# Patient Record
Sex: Female | Born: 1990 | Race: Black or African American | Hispanic: No | Marital: Single | Smoking: Never smoker
Health system: Southern US, Community
[De-identification: ages and names within clinical notes are randomized; demographics above are authoritative.]

## PROBLEM LIST (undated history)

## (undated) ENCOUNTER — Inpatient Hospital Stay (HOSPITAL_COMMUNITY): Payer: Self-pay

## (undated) DIAGNOSIS — G4730 Sleep apnea, unspecified: Secondary | ICD-10-CM

## (undated) DIAGNOSIS — R87629 Unspecified abnormal cytological findings in specimens from vagina: Secondary | ICD-10-CM

## (undated) DIAGNOSIS — D649 Anemia, unspecified: Secondary | ICD-10-CM

## (undated) DIAGNOSIS — N83209 Unspecified ovarian cyst, unspecified side: Secondary | ICD-10-CM

## (undated) DIAGNOSIS — G43019 Migraine without aura, intractable, without status migrainosus: Secondary | ICD-10-CM

## (undated) DIAGNOSIS — G40909 Epilepsy, unspecified, not intractable, without status epilepticus: Secondary | ICD-10-CM

## (undated) DIAGNOSIS — G47419 Narcolepsy without cataplexy: Secondary | ICD-10-CM

## (undated) DIAGNOSIS — R0600 Dyspnea, unspecified: Secondary | ICD-10-CM

## (undated) HISTORY — DX: Sleep apnea, unspecified: G47.30

## (undated) HISTORY — PX: NO PAST SURGERIES: SHX2092

## (undated) HISTORY — DX: Dyspnea, unspecified: R06.00

## (undated) HISTORY — DX: Migraine without aura, intractable, without status migrainosus: G43.019

## (undated) HISTORY — DX: Morbid (severe) obesity due to excess calories: E66.01

---

## 2017-01-25 ENCOUNTER — Emergency Department (HOSPITAL_COMMUNITY)
Admission: EM | Admit: 2017-01-25 | Discharge: 2017-01-26 | Disposition: A | Discharge status: Home / Self Care | Payer: Medicaid Other | Attending: Emergency Medicine | Admitting: Emergency Medicine

## 2017-01-25 DIAGNOSIS — N730 Acute parametritis and pelvic cellulitis: Secondary | ICD-10-CM | POA: Diagnosis not present

## 2017-01-25 DIAGNOSIS — R1030 Lower abdominal pain, unspecified: Secondary | ICD-10-CM | POA: Diagnosis present

## 2017-01-25 LAB — URINALYSIS, ROUTINE W REFLEX MICROSCOPIC
BILIRUBIN URINE: NEGATIVE
GLUCOSE, UA: NEGATIVE mg/dL
Hgb urine dipstick: NEGATIVE
Ketones, ur: NEGATIVE mg/dL
Leukocytes, UA: NEGATIVE
Nitrite: NEGATIVE
PH: 5 (ref 5.0–8.0)
Protein, ur: NEGATIVE mg/dL
Specific Gravity, Urine: 1.028 (ref 1.005–1.030)

## 2017-01-25 LAB — COMPREHENSIVE METABOLIC PANEL
ALBUMIN: 3.4 g/dL — AB (ref 3.5–5.0)
ALT: 13 U/L — ABNORMAL LOW (ref 14–54)
ANION GAP: 8 (ref 5–15)
AST: 17 U/L (ref 15–41)
Alkaline Phosphatase: 64 U/L (ref 38–126)
BUN: 7 mg/dL (ref 6–20)
CO2: 26 mmol/L (ref 22–32)
Calcium: 8.8 mg/dL — ABNORMAL LOW (ref 8.9–10.3)
Chloride: 104 mmol/L (ref 101–111)
Creatinine, Ser: 0.86 mg/dL (ref 0.44–1.00)
GFR calc Af Amer: >60 mL/min (ref >60)
GFR calc non Af Amer: >60 mL/min (ref >60)
GLUCOSE: 85 mg/dL (ref 65–99)
POTASSIUM: 4.3 mmol/L (ref 3.5–5.1)
SODIUM: 138 mmol/L (ref 135–145)
Total Bilirubin: 0.4 mg/dL (ref 0.3–1.2)
Total Protein: 6.2 g/dL — ABNORMAL LOW (ref 6.5–8.1)

## 2017-01-25 LAB — CBC
HEMATOCRIT: 40.2 % (ref 36.0–46.0)
Hemoglobin: 12.7 g/dL (ref 12.0–15.0)
MCH: 25.9 pg — ABNORMAL LOW (ref 26.0–34.0)
MCHC: 31.6 g/dL (ref 30.0–36.0)
MCV: 81.9 fL (ref 78.0–100.0)
Platelets: 322 10*3/uL (ref 150–400)
RBC: 4.91 MIL/uL (ref 3.87–5.11)
RDW: 14.2 % (ref 11.5–15.5)
WBC: 10.8 10*3/uL — ABNORMAL HIGH (ref 4.0–10.5)

## 2017-01-25 LAB — LIPASE, BLOOD: Lipase: 15 U/L (ref 11–51)

## 2017-01-25 NOTE — ED Notes (Signed)
Pt up to desk asking for update, provided answers to questions and apologized for delay.

## 2017-01-25 NOTE — ED Triage Notes (Signed)
Pt states for the last 2 days she has been having lower abd cramping and body aches all over. Pt denies any n/v/d. Pt reports history of ovarian cyst.

## 2017-01-26 LAB — WET PREP, GENITAL
SPERM: NONE SEEN
Trich, Wet Prep: NONE SEEN
Yeast Wet Prep HPF POC: NONE SEEN

## 2017-01-26 LAB — RAPID HIV SCREEN (HIV 1/2 AB+AG)
HIV 1/2 ANTIBODIES: NONREACTIVE
HIV-1 P24 ANTIGEN - HIV24: NONREACTIVE

## 2017-01-26 LAB — GC/CHLAMYDIA PROBE AMP (~~LOC~~) NOT AT ARMC
Chlamydia: NEGATIVE
Neisseria Gonorrhea: NEGATIVE

## 2017-01-26 LAB — I-STAT BETA HCG BLOOD, ED (MC, WL, AP ONLY): I-stat hCG, quantitative: <5 m[IU]/mL (ref <5)

## 2017-01-26 LAB — HIV ANTIBODY (ROUTINE TESTING W REFLEX): HIV SCREEN 4TH GENERATION: NONREACTIVE

## 2017-01-26 MED ORDER — CEFTRIAXONE SODIUM 250 MG IJ SOLR
250.0000 mg | Freq: Once | INTRAMUSCULAR | Status: AC
  Administered 2017-01-26: 250 mg via INTRAMUSCULAR
  Filled 2017-01-26: qty 250

## 2017-01-26 MED ORDER — AZITHROMYCIN 250 MG PO TABS
1000.0000 mg | ORAL_TABLET | Freq: Once | ORAL | Status: AC
  Administered 2017-01-26: 1000 mg via ORAL
  Filled 2017-01-26: qty 4

## 2017-01-26 MED ORDER — DOXYCYCLINE HYCLATE 100 MG PO CAPS: 100.0000 mg | ORAL_CAPSULE | Freq: Two times a day (BID) | ORAL | 0 refills | Status: DC

## 2017-01-26 NOTE — ED Provider Notes (Signed)
[facility]-EMERGENCY DEPT Provider Note   CSN: [id number] Arrival date & time: 01/25/17  1815     History   Chief Complaint Chief Complaint  Patient presents with  . Abdominal Pain    HPI Heather Ward is a 26 y.o. female.  HPI Pt comes in with cc of abd pain. Pt reports that she has been having lower abd pain, cramping for the last2 days. Pain is intermittent, but getting worse. Pt has hx of ovarian cyst. Pt  Has no uti like symptoms and denies any vaginal bleeding. Pt is having unprotected intercourse with 1 partner, she is not sure if he is monogamous. No n/v/f/c/diarrhea.   No past medical history on file.  There are no active problems to display for this patient.   No past surgical history on file.  OB History    No data available       Home Medications    Prior to Admission medications   Medication Sig Start Date End Date Taking? Authorizing Provider  ibuprofen (ADVIL,MOTRIN) 800 MG tablet Take 800 mg by mouth every 8 (eight) hours as needed for moderate pain.   Yes Historical Provider, MD  doxycycline (VIBRAMYCIN) 100 MG capsule Take 1 capsule (100 mg total) by mouth 2 (two) times daily. 01/26/17   Derwood Kaplan, MD    Family History No family history on file.  Social History Social History  Substance Use Topics  . Smoking status: Not on file  . Smokeless tobacco: Not on file  . Alcohol use Not on file     Allergies   Patient has no known allergies.   Review of Systems Review of Systems  Gastrointestinal: Positive for abdominal pain.   ROS 10 Systems reviewed and are negative for acute change except as noted in the HPI.      Physical Exam Updated Vital Signs BP (!) 119/53   Pulse 70   Temp 98.5 F (36.9 C)   Resp 18   Ht 5\' 7"  (1.702 m)   Wt (!) 301 lb (136.5 kg)   LMP 01/16/2017   SpO2 100%   BMI 47.14 kg/m   Physical Exam  Constitutional: She is oriented to person, place, and time. She appears well-developed.  HENT:  Head:  Normocephalic and atraumatic.  Eyes: Conjunctivae and EOM are normal. Pupils are equal, round, and reactive to light.  Neck: Normal range of motion. Neck supple.  Cardiovascular: Normal rate, regular rhythm, normal heart sounds and intact distal pulses.   No murmur heard. Pulmonary/Chest: Effort normal. No respiratory distress. She has no wheezes.  Abdominal: Soft. Bowel sounds are normal. She exhibits no distension. There is no tenderness. There is no rebound and no guarding.  Genitourinary: Vagina normal and uterus normal.  Genitourinary Comments: External exam - normal, no lesions Speculum exam: Pt has some white discharge, no blood Bimanual exam: Patient has CMT, no adnexal tenderness or fullness and cervical os is closed  Neurological: She is alert and oriented to person, place, and time.  Skin: Skin is warm and dry.  Nursing note and vitals reviewed.    ED Treatments / Results  Labs (all labs ordered are listed, but only abnormal results are displayed) Labs Reviewed  WET PREP, GENITAL - Abnormal; Notable for the following:       Result Value   Clue Cells Wet Prep HPF POC PRESENT (*)    WBC, Wet Prep HPF POC FEW (*)    All other components within normal limits  COMPREHENSIVE METABOLIC PANEL -  Abnormal; Notable for the following:    Calcium 8.8 (*)    Total Protein 6.2 (*)    Albumin 3.4 (*)    ALT 13 (*)    All other components within normal limits  CBC - Abnormal; Notable for the following:    WBC 10.8 (*)    MCH 25.9 (*)    All other components within normal limits  LIPASE, BLOOD  URINALYSIS, ROUTINE W REFLEX MICROSCOPIC  RAPID HIV SCREEN (HIV 1/2 AB+AG)  HIV ANTIBODY (ROUTINE TESTING)  POC URINE PREG, ED  I-STAT BETA HCG BLOOD, ED ([facility], [facility], [facility] ONLY)  GC/CHLAMYDIA PROBE AMP ([place]) NOT AT [facility]    EKG  EKG Interpretation None       Radiology No results found.  Procedures Procedures (including critical care time)  Medications Ordered in  ED Medications  azithromycin (ZITHROMAX) tablet 1,000 mg (1,000 mg Oral Given 01/26/17 0102)  cefTRIAXone (ROCEPHIN) injection 250 mg (250 mg Intramuscular Given 01/26/17 0102)     Initial Impression / Assessment and Plan / ED Course  I have reviewed the triage vital signs and the nursing notes.  Pertinent labs & imaging results that were available during my care of the patient were reviewed by me and considered in my medical decision making (see chart for details).     Pt comes in with cc of abd pain. Pt has lower abd pain / pelvic pain and her pelvic exam reveals cervical motion tenderness. We will treat her for GC and Chlamydia, and d/c with doxy. Strict ER return precautions have been discussed, and patient is agreeing with the plan and is comfortable with the workup done and the recommendations from the ER.   Final Clinical Impressions(s) / ED Diagnoses   Final diagnoses:  PID (acute pelvic inflammatory disease)    New Prescriptions New Prescriptions   DOXYCYCLINE (VIBRAMYCIN) 100 MG CAPSULE    Take 1 capsule (100 mg total) by mouth 2 (two) times daily.     Derwood KaplanAnkit Dezaray Shibuya, MD 01/26/17 [id number]

## 2017-01-26 NOTE — Discharge Instructions (Signed)
We suspect that you have a pelvic infection.  We treated you for gonorrhea and chlamydia infection while you were in the ER Rest of the results show: There is no evidence of Urinary tract infection and the pregnancy test is negative as well.  PLEASE PRACTICE SAFE SEX. PLEASE INFORM YOUR PARTNER TO GET CHECKED AND TREATED FOR STD AS WELL.

## 2017-02-27 ENCOUNTER — Inpatient Hospital Stay (HOSPITAL_COMMUNITY): Payer: Medicaid Other

## 2017-02-27 ENCOUNTER — Encounter (HOSPITAL_COMMUNITY): Payer: Self-pay | Admitting: *Deleted

## 2017-02-27 ENCOUNTER — Inpatient Hospital Stay (HOSPITAL_COMMUNITY)
Admission: AD | Admit: 2017-02-27 | Discharge: 2017-02-27 | Disposition: A | Discharge status: Home / Self Care | Payer: Medicaid Other | Source: Ambulatory Visit | Attending: Obstetrics and Gynecology | Admitting: Obstetrics and Gynecology

## 2017-02-27 DIAGNOSIS — Z3A10 10 weeks gestation of pregnancy: Secondary | ICD-10-CM | POA: Diagnosis not present

## 2017-02-27 DIAGNOSIS — Z3491 Encounter for supervision of normal pregnancy, unspecified, first trimester: Secondary | ICD-10-CM

## 2017-02-27 DIAGNOSIS — O26891 Other specified pregnancy related conditions, first trimester: Secondary | ICD-10-CM

## 2017-02-27 DIAGNOSIS — Z8669 Personal history of other diseases of the nervous system and sense organs: Secondary | ICD-10-CM

## 2017-02-27 DIAGNOSIS — O3680X0 Pregnancy with inconclusive fetal viability, not applicable or unspecified: Secondary | ICD-10-CM | POA: Diagnosis not present

## 2017-02-27 DIAGNOSIS — O9989 Other specified diseases and conditions complicating pregnancy, childbirth and the puerperium: Secondary | ICD-10-CM

## 2017-02-27 DIAGNOSIS — B9689 Other specified bacterial agents as the cause of diseases classified elsewhere: Secondary | ICD-10-CM | POA: Diagnosis not present

## 2017-02-27 DIAGNOSIS — O23591 Infection of other part of genital tract in pregnancy, first trimester: Secondary | ICD-10-CM | POA: Insufficient documentation

## 2017-02-27 DIAGNOSIS — R1030 Lower abdominal pain, unspecified: Secondary | ICD-10-CM | POA: Diagnosis present

## 2017-02-27 DIAGNOSIS — N760 Acute vaginitis: Secondary | ICD-10-CM

## 2017-02-27 DIAGNOSIS — M549 Dorsalgia, unspecified: Secondary | ICD-10-CM

## 2017-02-27 DIAGNOSIS — R109 Unspecified abdominal pain: Secondary | ICD-10-CM | POA: Diagnosis not present

## 2017-02-27 DIAGNOSIS — O26899 Other specified pregnancy related conditions, unspecified trimester: Secondary | ICD-10-CM

## 2017-02-27 HISTORY — DX: Unspecified ovarian cyst, unspecified side: N83.209

## 2017-02-27 HISTORY — DX: Narcolepsy without cataplexy: G47.419

## 2017-02-27 HISTORY — DX: Epilepsy, unspecified, not intractable, without status epilepticus: G40.909

## 2017-02-27 LAB — URINALYSIS, ROUTINE W REFLEX MICROSCOPIC
Bilirubin Urine: NEGATIVE
GLUCOSE, UA: NEGATIVE mg/dL
HGB URINE DIPSTICK: NEGATIVE
KETONES UR: NEGATIVE mg/dL
Leukocytes, UA: NEGATIVE
Nitrite: NEGATIVE
PROTEIN: NEGATIVE mg/dL
Specific Gravity, Urine: 1.019 (ref 1.005–1.030)
pH: 6 (ref 5.0–8.0)

## 2017-02-27 LAB — CBC
HCT: 34.1 % — ABNORMAL LOW (ref 36.0–46.0)
Hemoglobin: 11 g/dL — ABNORMAL LOW (ref 12.0–15.0)
MCH: 25.9 pg — AB (ref 26.0–34.0)
MCHC: 32.3 g/dL (ref 30.0–36.0)
MCV: 80.2 fL (ref 78.0–100.0)
PLATELETS: 262 10*3/uL (ref 150–400)
RBC: 4.25 MIL/uL (ref 3.87–5.11)
RDW: 14.9 % (ref 11.5–15.5)
WBC: 10.8 10*3/uL — ABNORMAL HIGH (ref 4.0–10.5)

## 2017-02-27 LAB — POCT PREGNANCY, URINE: Preg Test, Ur: POSITIVE — AB

## 2017-02-27 LAB — WET PREP, GENITAL
Sperm: NONE SEEN
Trich, Wet Prep: NONE SEEN
Yeast Wet Prep HPF POC: NONE SEEN

## 2017-02-27 LAB — HCG, QUANTITATIVE, PREGNANCY: HCG, BETA CHAIN, QUANT, S: 25189 m[IU]/mL — AB (ref <5)

## 2017-02-27 MED ORDER — METRONIDAZOLE 500 MG PO TABS: 500.0000 mg | ORAL_TABLET | Freq: Two times a day (BID) | ORAL | 0 refills | Status: DC

## 2017-02-27 NOTE — Discharge Instructions (Signed)
Bacterial Vaginosis Bacterial vaginosis is an infection of the vagina. It happens when too many germs (bacteria) grow in the vagina. This infection puts you at risk for infections from sex (STIs). Treating this infection can lower your risk for some STIs. You should also treat this if you are pregnant. It can cause your baby to be born early. Follow these instructions at home: Medicines   Take over-the-counter and prescription medicines only as told by your doctor.  Take or use your antibiotic medicine as told by your doctor. Do not stop taking or using it even if you start to feel better. General instructions   If you your sexual partner is a woman, tell her that you have this infection. She needs to get treatment if she has symptoms. If you have a female partner, he does not need to be treated.  During treatment:  Avoid sex.  Do not douche.  Avoid alcohol as told.  Avoid breastfeeding as told.  Drink enough fluid to keep your pee (urine) clear or pale yellow.  Keep your vagina and butt (rectum) clean.  Wash the area with warm water every day.  Wipe from front to back after you use the toilet.  Keep all follow-up visits as told by your doctor. This is important. Preventing this condition   Do not douche.  Use only warm water to wash around your vagina.  Use protection when you have sex. This includes:  Latex condoms.  Dental dams.  Limit how many people you have sex with. It is best to only have sex with the same person (be monogamous).  Get tested for STIs. Have your partner get tested.  Wear underwear that is cotton or lined with cotton.  Avoid tight pants and pantyhose. This is most important in summer.  Do not use any products that have nicotine or tobacco in them. These include cigarettes and e-cigarettes. If you need help quitting, ask your doctor.  Do not use illegal drugs.  Limit how much alcohol you drink. Contact a doctor if:  Your symptoms do not  get better, even after you are treated.  You have more discharge or pain when you pee (urinate).  You have a fever.  You have pain in your belly (abdomen).  You have pain with sex.  Your bleed from your vagina between periods. Summary  This infection happens when too many germs (bacteria) grow in the vagina.  Treating this condition can lower your risk for some infections from sex (STIs).  You should also treat this if you are pregnant. It can cause early (premature) birth.  Do not stop taking or using your antibiotic medicine even if you start to feel better. This information is not intended to replace advice given to you by your health care provider. Make sure you discuss any questions you have with your health care provider. Document Released: 08/09/2008 Document Revised: 07/16/2016 Document Reviewed: 07/16/2016 Elsevier Interactive Patient Education  2017 [employer] Abdominal Pain During Pregnancy Abdominal pain is common in pregnancy. Most of the time, it does not cause harm. There are many causes of abdominal pain. Some causes are more serious than others and sometimes the cause is not known. Abdominal pain can be a sign that something is very wrong with the pregnancy or the pain may have nothing to do with the pregnancy. Always tell your health care provider if you have any abdominal pain. Follow these instructions at home:  Do not have sex or put anything in your  vagina until your symptoms go away completely.  Watch your abdominal pain for any changes.  Get plenty of rest until your pain improves.  Drink enough fluid to keep your urine clear or pale yellow.  Take over-the-counter or prescription medicines only as told by your health care provider.  Keep all follow-up visits as told by your health care provider. This is important. Contact a health care provider if:  You have a fever.  Your pain gets worse or you have cramping.  Your pain continues after  resting. Get help right away if:  You are bleeding, leaking fluid, or passing tissue from the vagina.  You have vomiting or diarrhea that does not go away.  You have painful or bloody urination.  You notice a decrease in your baby's movements.  You feel very weak or faint.  You have shortness of breath.  You develop a severe headache with abdominal pain.  You have abnormal vaginal discharge with abdominal pain. This information is not intended to replace advice given to you by your health care provider. Make sure you discuss any questions you have with your health care provider. Document Released: 10/31/2005 Document Revised: 08/11/2016 Document Reviewed: 05/30/2013 Elsevier Interactive Patient Education  2017 [employer].

## 2017-02-27 NOTE — MAU Note (Signed)
Been having the same lower back pain and throughout stomach.  Was dx a few weeks ago at [facility] with PID.  Doesn't believe the dx.  +HPT 4 days ago.

## 2017-02-27 NOTE — MAU Provider Note (Signed)
History     CSN: [id number]  Arrival date and time: 02/27/17 1819   First Provider Initiated Contact with Patient 02/27/17 1923      Chief Complaint  Patient presents with  . Possible Pregnancy  . Abdominal Pain  . Back Pain   [id number]  by LMP here with lower abdominal pain and back pain. Sx started 4 weeks ago. She describes the back pain as low and burning. She describes the abdominal pain as mostly on right flank and below umbilicus, it is intermittent and sharp. No urinary sx. No vomiting, diarrhea, or constipation. Reports some nausea. No VB or vaginal discharge, some vaginal itching is present. She reports hx of seizure disorder and not currently on meds d/t running out. Last seizure was last week. She is unsure of seizure type but her feet go numb then the rest of her body and sometimes she falls asleep. She was taking Keppra but unsure of dose.   OB History    Gravida Para Term Preterm AB Living   0 0 2   SAB TAB Ectopic Multiple Live Births   0 0 0 0 2      Past Medical History:  Diagnosis Date  . Epilepsy (HCC)   . Narcolepsy   . Ovarian cyst     History reviewed. No pertinent surgical history.  History reviewed. No pertinent family history.  Social History  Substance Use Topics  . Smoking status: Never Smoker  . Smokeless tobacco: Never Used  . Alcohol use Yes     Comment: social drinker    Allergies:  Allergies  Allergen Reactions  . Iodine Itching  . Latex Rash    Prescriptions Prior to Admission  Medication Sig Dispense Refill Last Dose  . doxycycline (VIBRAMYCIN) 100 MG capsule Take 1 capsule (100 mg total) by mouth 2 (two) times daily. 28 capsule 0   . ibuprofen (ADVIL,MOTRIN) 800 MG tablet Take 800 mg by mouth every 8 (eight) hours as needed for moderate pain.   01/25/2017 at Unknown time    Review of Systems  Constitutional: Negative for fever.  Gastrointestinal: Positive for abdominal pain and nausea. Negative for constipation,  diarrhea and vomiting.  Genitourinary: Positive for frequency. Negative for dysuria, hematuria, urgency, vaginal bleeding and vaginal discharge.  Musculoskeletal: Positive for back pain.   Physical Exam   Blood pressure 140/79, pulse 70, temperature 98.4 F (36.9 C), temperature source Oral, resp. rate 18, weight (!) 140 kg (308 lb 12 oz), last menstrual period 12/19/2016, SpO2 100 %.  Physical Exam  Nursing note and vitals reviewed. Constitutional: She is oriented to person, place, and time. She appears well-developed and well-nourished. No distress (appears comfortable).  HENT:  Head: Normocephalic and atraumatic.  Neck: Normal range of motion.  Cardiovascular: Normal rate and regular rhythm.   Respiratory: Effort normal and breath sounds normal.  GI: Soft. She exhibits no distension and no mass. There is no tenderness. There is no rebound, no guarding and no CVA tenderness.  Genitourinary:  Genitourinary Comments: External: no lesions or erythema Vagina: rugated, parous, thin white discharge w/malodor Uterus: + enlarged, anteverted, non tender, no CMT Adnexae: no masses, no tenderness left, no tenderness right   Musculoskeletal: Normal range of motion.       Cervical back: Normal. She exhibits no tenderness and no edema.       Thoracic back: Normal. She exhibits no tenderness and no edema.       Lumbar back: Normal. She exhibits  no tenderness and no edema.  Neurological: She is alert and oriented to person, place, and time.  Skin: Skin is warm and dry.  Psychiatric: She has a normal mood and affect.   Results for orders placed or performed during the hospital encounter of 02/27/17 (from the past 24 hour(s))  Urinalysis, Routine w reflex microscopic     Status: None   Collection Time: 02/27/17  6:44 PM  Result Value Ref Range   Color, Urine YELLOW YELLOW   APPearance CLEAR CLEAR   Specific Gravity, Urine 1.019 1.005 - 1.030   pH 6.0 5.0 - 8.0   Glucose, UA NEGATIVE NEGATIVE  mg/dL   Hgb urine dipstick NEGATIVE NEGATIVE   Bilirubin Urine NEGATIVE NEGATIVE   Ketones, ur NEGATIVE NEGATIVE mg/dL   Protein, ur NEGATIVE NEGATIVE mg/dL   Nitrite NEGATIVE NEGATIVE   Leukocytes, UA NEGATIVE NEGATIVE  Pregnancy, urine POC     Status: Abnormal   Collection Time: 02/27/17  7:09 PM  Result Value Ref Range   Preg Test, Ur POSITIVE (A) NEGATIVE  Wet prep, genital     Status: Abnormal   Collection Time: 02/27/17  7:33 PM  Result Value Ref Range   Yeast Wet Prep HPF POC NONE SEEN NONE SEEN   Trich, Wet Prep NONE SEEN NONE SEEN   Clue Cells Wet Prep HPF POC PRESENT (A) NONE SEEN   WBC, Wet Prep HPF POC FEW (A) NONE SEEN   Sperm NONE SEEN   CBC     Status: Abnormal   Collection Time: 02/27/17  7:42 PM  Result Value Ref Range   WBC 10.8 (H) 4.0 - 10.5 K/uL   RBC 4.25 3.87 - 5.11 MIL/uL   Hemoglobin 11.0 (L) 12.0 - 15.0 g/dL   HCT 78.2 (L) 95.6 - 21.3 %   MCV 80.2 78.0 - 100.0 fL   MCH 25.9 (L) 26.0 - 34.0 pg   MCHC 32.3 30.0 - 36.0 g/dL   RDW 08.6 57.8 - 46.9 %   Platelets 262 150 - 400 K/uL   US Ob Comp Less 14 Wks  Result Date: 02/27/2017 CLINICAL DATA:  26 y/o F; lower abdominal pain and cramping since mid March. EXAM: OBSTETRIC <14 WK [place] AND TRANSVAGINAL OB US TECHNIQUE: Both transabdominal and transvaginal ultrasound examinations were performed for complete evaluation of the gestation as well as the maternal uterus, adnexal regions, and pelvic cul-de-sac. Transvaginal technique was performed to assess early pregnancy. COMPARISON:  None. FINDINGS: Intrauterine gestational sac: Single Yolk sac:  Visualized. Embryo:  Visualized. Cardiac Activity: Visualized. Heart Rate: 96  bpm CRL:  2.3 mm  mm   5 w   5 d                  [place] EDC: 10/22/2017 Subchorionic hemorrhage:  None visualized. Maternal uterus/adnexae: 3.6 cm simple left ovarian cyst. Otherwise normal. IMPRESSION: 1. Single live intrauterine pregnancy. 2. Fetal bradycardia, 96 bpm. Electronically Signed   By: Mitzi Hansen M.D.   On: 02/27/2017 20:27   US Ob Transvaginal  Result Date: 02/27/2017 CLINICAL DATA:  26 y/o F; lower abdominal pain and cramping since mid March. EXAM: OBSTETRIC <14 WK [place] AND TRANSVAGINAL OB US TECHNIQUE: Both transabdominal and transvaginal ultrasound examinations were performed for complete evaluation of the gestation as well as the maternal uterus, adnexal regions, and pelvic cul-de-sac. Transvaginal technique was performed to assess early pregnancy. COMPARISON:  None. FINDINGS: Intrauterine gestational sac: Single Yolk sac:  Visualized. Embryo:  Visualized. Cardiac Activity:  Visualized. Heart Rate: 96  bpm CRL:  2.3 mm  mm   5 w   5 d                  [place] EDC: 10/22/2017 Subchorionic hemorrhage:  None visualized. Maternal uterus/adnexae: 3.6 cm simple left ovarian cyst. Otherwise normal. IMPRESSION: 1. Single live intrauterine pregnancy. 2. Fetal bradycardia, 96 bpm. Electronically Signed   By: Mitzi Hansen M.D.   On: 02/27/2017 20:27   MAU Course  Procedures  MDM Labs and [place] ordered and reviewed. Normal IUP on [place], FHR slightly low, will rpt [place] in 2 weeks for viability. Pain likely caused by BV and/or physiologic to early pregnancy. Back pain likely MSK, can try Tylenol and heat. Will need to start care and referral to Neuro for evaluation and medication mngt. Stable for discharge home.  Assessment and Plan   1. Normal intrauterine pregnancy on prenatal ultrasound in first trimester   2. Abdominal pain in pregnancy   3. Back pain affecting pregnancy in first trimester   4. History of seizure disorder   5. Pregnancy with inconclusive fetal viability, single or unspecified fetus   6. Bacterial vaginosis    Discharge home Follow up in 2 weeks for [place] Follow up at [facility] to start [facility] in 4-5 weeks SAB precautions Tylenol prn back pain Rx Flagyl  Allergies as of 02/27/2017      Reactions   Iodine Itching   Latex Rash      Medication List    STOP  taking these medications   doxycycline 100 MG capsule Commonly known as:  VIBRAMYCIN   ibuprofen 800 MG tablet Commonly known as:  ADVIL,MOTRIN     TAKE these medications   metroNIDAZOLE 500 MG tablet Commonly known as:  FLAGYL Take 1 tablet (500 mg total) by mouth 2 (two) times daily.      Donette Larry, CNM 02/27/2017, 7:38 PM

## 2017-02-28 LAB — GC/CHLAMYDIA PROBE AMP (~~LOC~~) NOT AT ARMC
CHLAMYDIA, DNA PROBE: NEGATIVE
NEISSERIA GONORRHEA: NEGATIVE

## 2017-02-28 LAB — HIV ANTIBODY (ROUTINE TESTING W REFLEX): HIV Screen 4th Generation wRfx: NONREACTIVE

## 2017-03-13 ENCOUNTER — Ambulatory Visit: Payer: Medicaid Other

## 2017-03-13 ENCOUNTER — Ambulatory Visit (HOSPITAL_COMMUNITY)
Admission: RE | Admit: 2017-03-13 | Discharge: 2017-03-13 | Disposition: A | Discharge status: Home / Self Care | Payer: Medicaid Other | Source: Ambulatory Visit | Attending: Certified Nurse Midwife | Admitting: Certified Nurse Midwife

## 2017-03-13 DIAGNOSIS — Z3A01 Less than 8 weeks gestation of pregnancy: Secondary | ICD-10-CM | POA: Diagnosis not present

## 2017-03-13 DIAGNOSIS — O26899 Other specified pregnancy related conditions, unspecified trimester: Secondary | ICD-10-CM

## 2017-03-13 DIAGNOSIS — Z712 Person consulting for explanation of examination or test findings: Secondary | ICD-10-CM

## 2017-03-13 DIAGNOSIS — O26891 Other specified pregnancy related conditions, first trimester: Secondary | ICD-10-CM | POA: Diagnosis not present

## 2017-03-13 DIAGNOSIS — R109 Unspecified abdominal pain: Secondary | ICD-10-CM | POA: Insufficient documentation

## 2017-03-13 DIAGNOSIS — O3680X0 Pregnancy with inconclusive fetal viability, not applicable or unspecified: Secondary | ICD-10-CM | POA: Insufficient documentation

## 2017-03-13 NOTE — Progress Notes (Signed)
Patient presented to office today from ultrasound appointment. Results have been given and patient is 7 weeks and 5 days with due date of 10/25/2017. Patient was advised to start prenatal vitamins. She reports having a new ob appointment in the next two weeks.

## 2017-03-29 ENCOUNTER — Encounter: Payer: Self-pay | Admitting: *Deleted

## 2017-03-29 ENCOUNTER — Encounter: Payer: Self-pay | Admitting: Obstetrics and Gynecology

## 2017-03-29 ENCOUNTER — Other Ambulatory Visit (HOSPITAL_COMMUNITY)
Admission: RE | Admit: 2017-03-29 | Discharge: 2017-03-29 | Disposition: A | Discharge status: Home / Self Care | Payer: Medicaid Other | Source: Ambulatory Visit | Attending: Obstetrics and Gynecology | Admitting: Obstetrics and Gynecology

## 2017-03-29 ENCOUNTER — Ambulatory Visit (INDEPENDENT_AMBULATORY_CARE_PROVIDER_SITE_OTHER): Payer: Medicaid Other | Admitting: Obstetrics and Gynecology

## 2017-03-29 VITALS — BP 115/71 | HR 71 | Temp 97.7°F | Wt 308.0 lb

## 2017-03-29 DIAGNOSIS — O99352 Diseases of the nervous system complicating pregnancy, second trimester: Secondary | ICD-10-CM | POA: Diagnosis not present

## 2017-03-29 DIAGNOSIS — G47411 Narcolepsy with cataplexy: Secondary | ICD-10-CM | POA: Diagnosis not present

## 2017-03-29 DIAGNOSIS — Z3483 Encounter for supervision of other normal pregnancy, third trimester: Secondary | ICD-10-CM

## 2017-03-29 DIAGNOSIS — Z3490 Encounter for supervision of normal pregnancy, unspecified, unspecified trimester: Secondary | ICD-10-CM

## 2017-03-29 DIAGNOSIS — O0992 Supervision of high risk pregnancy, unspecified, second trimester: Secondary | ICD-10-CM

## 2017-03-29 DIAGNOSIS — O0990 Supervision of high risk pregnancy, unspecified, unspecified trimester: Secondary | ICD-10-CM | POA: Insufficient documentation

## 2017-03-29 DIAGNOSIS — R569 Unspecified convulsions: Secondary | ICD-10-CM | POA: Insufficient documentation

## 2017-03-29 DIAGNOSIS — G47419 Narcolepsy without cataplexy: Secondary | ICD-10-CM | POA: Insufficient documentation

## 2017-03-29 MED ORDER — PRENATAL MULTIVITAMIN CH: 1.0000 | ORAL_TABLET | Freq: Every day | ORAL | 12 refills | Status: AC

## 2017-03-29 NOTE — Patient Instructions (Signed)
First Trimester of Pregnancy The first trimester of pregnancy is from week 1 until the end of week 13 (months 1 through 3). A week after a sperm fertilizes an egg, the egg will implant on the wall of the uterus. This embryo will begin to develop into a baby. Genes from you and your partner will form the baby. The female genes will determine whether the baby will be a boy or a girl. At 6-8 weeks, the eyes and face will be formed, and the heartbeat can be seen on ultrasound. At the end of 12 weeks, all the baby's organs will be formed. Now that you are pregnant, you will want to do everything you can to have a healthy baby. Two of the most important things are to get good prenatal care and to follow your health care provider's instructions. Prenatal care is all the medical care you receive before the baby's birth. This care will help prevent, find, and treat any problems during the pregnancy and childbirth. Body changes during your first trimester Your body goes through many changes during pregnancy. The changes vary from woman to woman.  You may gain or lose a couple of pounds at first.  You may feel sick to your stomach (nauseous) and you may throw up (vomit). If the vomiting is uncontrollable, call your health care provider.  You may tire easily.  You may develop headaches that can be relieved by medicines. All medicines should be approved by your health care provider.  You may urinate more often. Painful urination may mean you have a bladder infection.  You may develop heartburn as a result of your pregnancy.  You may develop constipation because certain hormones are causing the muscles that push stool through your intestines to slow down.  You may develop hemorrhoids or swollen veins (varicose veins).  Your breasts may begin to grow larger and become tender. Your nipples may stick out more, and the tissue that surrounds them (areola) may become darker.  Your gums may bleed and may be  sensitive to brushing and flossing.  Dark spots or blotches (chloasma, mask of pregnancy) may develop on your face. This will likely fade after the baby is born.  Your menstrual periods will stop.  You may have a loss of appetite.  You may develop cravings for certain kinds of food.  You may have changes in your emotions from day to day, such as being excited to be pregnant or being concerned that something may go wrong with the pregnancy and baby.  You may have more vivid and strange dreams.  You may have changes in your hair. These can include thickening of your hair, rapid growth, and changes in texture. Some women also have hair loss during or after pregnancy, or hair that feels dry or thin. Your hair will most likely return to normal after your baby is born.  What to expect at prenatal visits During a routine prenatal visit:  You will be weighed to make sure you and the baby are growing normally.  Your blood pressure will be taken.  Your abdomen will be measured to track your baby's growth.  The fetal heartbeat will be listened to between weeks 10 and 14 of your pregnancy.  Test results from any previous visits will be discussed.  Your health care provider may ask you:  How you are feeling.  If you are feeling the baby move.  If you have had any abnormal symptoms, such as leaking fluid, bleeding, severe headaches,   or abdominal cramping.  If you are using any tobacco products, including cigarettes, chewing tobacco, and electronic cigarettes.  If you have any questions.  Other tests that may be performed during your first trimester include:  Blood tests to find your blood type and to check for the presence of any previous infections. The tests will also be used to check for low iron levels (anemia) and protein on red blood cells (Rh antibodies). Depending on your risk factors, or if you previously had diabetes during pregnancy, you may have tests to check for high blood  sugar that affects pregnant women (gestational diabetes).  Urine tests to check for infections, diabetes, or protein in the urine.  An ultrasound to confirm the proper growth and development of the baby.  Fetal screens for spinal cord problems (spina bifida) and Down syndrome.  HIV (human immunodeficiency virus) testing. Routine prenatal testing includes screening for HIV, unless you choose not to have this test.  You may need other tests to make sure you and the baby are doing well.  Follow these instructions at home: Medicines  Follow your health care provider's instructions regarding medicine use. Specific medicines may be either safe or unsafe to take during pregnancy.  Take a prenatal vitamin that contains at least 600 micrograms (mcg) of folic acid.  If you develop constipation, try taking a stool softener if your health care provider approves. Eating and drinking  Eat a balanced diet that includes fresh fruits and vegetables, whole grains, good sources of protein such as meat, eggs, or tofu, and low-fat dairy. Your health care provider will help you determine the amount of weight gain that is right for you.  Avoid raw meat and uncooked cheese. These carry germs that can cause birth defects in the baby.  Eating four or five small meals rather than three large meals a day may help relieve nausea and vomiting. If you start to feel nauseous, eating a few soda crackers can be helpful. Drinking liquids between meals, instead of during meals, also seems to help ease nausea and vomiting.  Limit foods that are high in fat and processed sugars, such as fried and sweet foods.  To prevent constipation: ? Eat foods that are high in fiber, such as fresh fruits and vegetables, whole grains, and beans. ? Drink enough fluid to keep your urine clear or pale yellow. Activity  Exercise only as directed by your health care provider. Most women can continue their usual exercise routine during  pregnancy. Try to exercise for 30 minutes at least 5 days a week. Exercising will help you: ? Control your weight. ? Stay in shape. ? Be prepared for labor and delivery.  Experiencing pain or cramping in the lower abdomen or lower back is a good sign that you should stop exercising. Check with your health care provider before continuing with normal exercises.  Try to avoid standing for long periods of time. Move your legs often if you must stand in one place for a long time.  Avoid heavy lifting.  Wear low-heeled shoes and practice good posture.  You may continue to have sex unless your health care provider tells you not to. Relieving pain and discomfort  Wear a good support bra to relieve breast tenderness.  Take warm sitz baths to soothe any pain or discomfort caused by hemorrhoids. Use hemorrhoid cream if your health care provider approves.  Rest with your legs elevated if you have leg cramps or low back pain.  If you develop   varicose veins in your legs, wear support hose. Elevate your feet for 15 minutes, 3-4 times a day. Limit salt in your diet. Prenatal care  Schedule your prenatal visits by the twelfth week of pregnancy. They are usually scheduled monthly at first, then more often in the last 2 months before delivery.  Write down your questions. Take them to your prenatal visits.  Keep all your prenatal visits as told by your health care provider. This is important. Safety  Wear your seat belt at all times when driving.  Make a list of emergency phone numbers, including numbers for family, friends, the hospital, and police and fire departments. General instructions  Ask your health care provider for a referral to a local prenatal education class. Begin classes no later than the beginning of month 6 of your pregnancy.  Ask for help if you have counseling or nutritional needs during pregnancy. Your health care provider can offer advice or refer you to specialists for help  with various needs.  Do not use hot tubs, steam rooms, or saunas.  Do not douche or use tampons or scented sanitary pads.  Do not cross your legs for long periods of time.  Avoid cat litter boxes and soil used by cats. These carry germs that can cause birth defects in the baby and possibly loss of the fetus by miscarriage or stillbirth.  Avoid all smoking, herbs, alcohol, and medicines not prescribed by your health care provider. Chemicals in these products affect the formation and growth of the baby.  Do not use any products that contain nicotine or tobacco, such as cigarettes and e-cigarettes. If you need help quitting, ask your health care provider. You may receive counseling support and other resources to help you quit.  Schedule a dentist appointment. At home, brush your teeth with a soft toothbrush and be gentle when you floss. Contact a health care provider if:  You have dizziness.  You have mild pelvic cramps, pelvic pressure, or nagging pain in the abdominal area.  You have persistent nausea, vomiting, or diarrhea.  You have a bad smelling vaginal discharge.  You have pain when you urinate.  You notice increased swelling in your face, hands, legs, or ankles.  You are exposed to fifth disease or chickenpox.  You are exposed to German measles (rubella) and have never had it. Get help right away if:  You have a fever.  You are leaking fluid from your vagina.  You have spotting or bleeding from your vagina.  You have severe abdominal cramping or pain.  You have rapid weight gain or loss.  You vomit blood or material that looks like coffee grounds.  You develop a severe headache.  You have shortness of breath.  You have any kind of trauma, such as from a fall or a car accident. Summary  The first trimester of pregnancy is from week 1 until the end of week 13 (months 1 through 3).  Your body goes through many changes during pregnancy. The changes vary from  woman to woman.  You will have routine prenatal visits. During those visits, your health care provider will examine you, discuss any test results you may have, and talk with you about how you are feeling. This information is not intended to replace advice given to you by your health care provider. Make sure you discuss any questions you have with your health care provider. Document Released: 10/25/2001 Document Revised: 10/12/2016 Document Reviewed: 10/12/2016 Elsevier Interactive Patient Education  2017 Elsevier   Inc.  

## 2017-03-29 NOTE — Progress Notes (Signed)
Subjective:  Heather Ward is a 26 y.o. G3P2002 at 8534w0d being seen today for her first OB appt.  EDD by first trimester U/S. TSVD x 2 without problems. H/O Sz D/O and narcolepsy.  She has been out of meds for both conditions since Aug/Sept. Was taking Kappera. Last Sz was a few weeks ago. She is currently monitored for the following issues for this high-risk pregnancy and has Supervision of normal pregnancy, antepartum; Seizure (HCC); and Narcolepsy on her problem list.  Patient reports no complaints.  Contractions: Not present. Vag. Bleeding: None.   . Denies leaking of fluid.   The following portions of the patient's history were reviewed and updated as appropriate: allergies, current medications, past family history, past medical history, past social history, past surgical history and problem list. Problem list updated.  Objective:   Vitals:   03/29/17 1029  BP: 115/71  Pulse: 71  Temp: 97.7 F (36.5 C)  Weight: (!) 308 lb (139.7 kg)    Fetal Status:           General:  Alert, oriented and cooperative. Patient is in no acute distress.  Skin: Skin is warm and dry. No rash noted.   Cardiovascular: Normal heart rate noted  Respiratory: Normal respiratory effort, no problems with respiration noted  Abdomen: Soft, gravid, appropriate for gestational age. Pain/Pressure: Present     Pelvic:  Cervical exam performed        Extremities: Normal range of motion.  Edema: None  Mental Status: Normal mood and affect. Normal behavior. Normal judgment and thought content.   Urinalysis: Urine Protein: Negative Urine Glucose: Negative  Assessment and Plan:  Pregnancy: G3P2002 at 9034w0d  1. Encounter for supervision of normal pregnancy, antepartum, unspecified gravidity Prenatal care and labs reviewed with pt.  - Obstetric Panel, Including HIV - Hemoglobinopathy evaluation - Cystic Fibrosis Mutation 97 - Culture, OB Urine - Cytology - PAP - Varicella zoster antibody, IgG  2. Seizure  [facility](HCC) - Ambulatory referral to Neurology  3. Primary narcolepsy with cataplexy Referral to Neurology  Preterm labor symptoms and general obstetric precautions including but not limited to vaginal bleeding, contractions, leaking of fluid and fetal movement were reviewed in detail with the patient. Please refer to After Visit Summary for other counseling recommendations.  Return in about 4 weeks (around 04/26/2017) for OB visit.   Hermina StaggersErvin, Infant Doane L, MD

## 2017-03-29 NOTE — Progress Notes (Signed)
Patient presents for New OB visit.  

## 2017-03-30 LAB — CYTOLOGY - PAP
ADEQUACY: ABSENT
DIAGNOSIS: NEGATIVE

## 2017-03-31 LAB — CULTURE, OB URINE

## 2017-03-31 LAB — URINE CULTURE, OB REFLEX

## 2017-04-03 ENCOUNTER — Encounter: Payer: Self-pay | Admitting: Neurology

## 2017-04-03 ENCOUNTER — Ambulatory Visit (INDEPENDENT_AMBULATORY_CARE_PROVIDER_SITE_OTHER): Payer: Medicaid Other | Admitting: Neurology

## 2017-04-03 VITALS — BP 116/73 | HR 78 | Ht 67.0 in | Wt 307.5 lb

## 2017-04-03 DIAGNOSIS — G43019 Migraine without aura, intractable, without status migrainosus: Secondary | ICD-10-CM | POA: Diagnosis not present

## 2017-04-03 DIAGNOSIS — G47419 Narcolepsy without cataplexy: Secondary | ICD-10-CM | POA: Diagnosis not present

## 2017-04-03 DIAGNOSIS — R569 Unspecified convulsions: Secondary | ICD-10-CM

## 2017-04-03 HISTORY — DX: Migraine without aura, intractable, without status migrainosus: G43.019

## 2017-04-03 HISTORY — DX: Morbid (severe) obesity due to excess calories: E66.01

## 2017-04-03 MED ORDER — AMITRIPTYLINE HCL 10 MG PO TABS: ORAL_TABLET | ORAL | 3 refills | Status: AC

## 2017-04-03 MED ORDER — LEVETIRACETAM 500 MG PO TABS: 500.0000 mg | ORAL_TABLET | Freq: Two times a day (BID) | ORAL | 5 refills | Status: AC

## 2017-04-03 NOTE — Progress Notes (Signed)
Reason for visit: Seizures  Referring physician: Dr. Nettie Elm  Heather Ward is a 26 y.o. female  History of present illness:  Heather Ward is a 26 year old right-handed black female with a history of morbid obesity and with a current pregnancy with due date on 10/25/2017. The patient has a history of seizure since she was 26 years old, most of her seizures have occurred out of sleep. The patient oftentimes will bite her fifth finger, as she sleeps with her finger in her mouth. She denies any tongue biting, she denies loss of control of the bowels or the bladder. The patient has had a workup in [place], [place]. A CT scan of the brain done on 08/30/2015 was unremarkable. The patient had moved to [place], [place], and more recently to this area. The patient has run out of her medications, she has had no seizure medication since September 2017. The patient last had a seizure about 2-1/2 weeks prior to this evaluation. The patient has had several seizures over the last several months. She also reports a history of frequent headaches, her headaches have been daily over the last 3 months, and are usually on the right side the head projecting to behind the eye, occasionally on the left side. The patient reports photophobia and phonophobia, with occasional nausea and vomiting. The patient reports persistent numbness of the hands bilaterally otherwise no numbness of the extremities and no weakness of the extremities. She does note some mild gait instability associated with dizziness. She has been taking Tylenol more recently for her headache, in the past she has taken ibuprofen. She has never been on a daily prescription medication. 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 She reports a history of narcolepsy with excessive daytime drowsiness, she is having difficulty staying awake while working. Her father and a brother have a history of seizures. She is  sent to this office for an evaluation.   Past Medical History:  Diagnosis Date  . Dyspnea   . Epilepsy (HCC)   . Narcolepsy   . Ovarian cyst   . Seizures (HCC)   . Sleep apnea     Past Surgical History:  Procedure Laterality Date  . NO PAST SURGERIES      Family History  Problem Relation Age of Onset  . Arthritis Mother   . Hypertension Mother   . Asthma Father   . Diabetes Father   . Drug abuse Father   . Hypertension Father   . Stroke Father   . Asthma Sister   . Diabetes Sister   . Hypertension Sister   . Miscarriages / Stillbirths Sister   . Mental illness Paternal Aunt   . Cancer Paternal Grandmother     Social history:  reports that she has never smoked. She has never used smokeless tobacco. She reports that she does not drink alcohol or use drugs.  Medications:  Prior to Admission medications   Medication Sig Start Date End Date Taking? Authorizing Provider  Prenatal Vit-Fe Fumarate-FA (PRENATAL MULTIVITAMIN) TABS tablet Take 1 tablet by mouth daily at 12 noon. 03/29/17  Yes Hermina Staggers, MD      Allergies  Allergen Reactions  . Iodine Itching  . Latex Rash    ROS:  Out of a complete 14 system review of symptoms, the patient complains only of the following symptoms, and all other reviewed systems are negative.  Headache, numbness, weakness Dizziness, seizures Sleepiness  Blood pressure 116/73,  pulse 78, height 5\' 7"  (1.702 m), weight (!) 307 lb 8 oz (139.5 kg), last menstrual period 12/19/2016.  Physical Exam  General: The patient is alert and cooperative at the time of the examination. The patient is morbidly obese.  Eyes: Pupils are equal, round, and reactive to light. Discs are flat bilaterally.  Neck: The neck is supple, no carotid bruits are noted.  Respiratory: The respiratory examination is clear.  Cardiovascular: The cardiovascular examination reveals a regular rate and rhythm, no obvious murmurs or rubs are noted.  Skin:  Extremities are without significant edema.  Neurologic Exam  Mental status: The patient is alert and oriented x 3 at the time of the examination. The patient has apparent normal recent and remote memory, with an apparently normal attention span and concentration ability.  Cranial nerves: Facial symmetry is present. There is good sensation of the face to pinprick and soft touch bilaterally. The strength of the facial muscles and the muscles to head turning and shoulder shrug are normal bilaterally. Speech is well enunciated, no aphasia or dysarthria is noted. Extraocular movements are full. Visual fields are full. The tongue is midline, and the patient has symmetric elevation of the soft palate. No obvious hearing deficits are noted.  Motor: The motor testing reveals 5 over 5 strength of all 4 extremities. Good symmetric motor tone is noted throughout.  Sensory: Sensory testing is intact to pinprick, soft touch, vibration sensation, and position sense on all 4 extremities. No evidence of extinction is noted.  Coordination: Cerebellar testing reveals good finger-nose-finger and heel-to-shin bilaterally.  Gait and station: Gait is normal. Tandem gait is normal. Romberg is negative. No drift is seen.  Reflexes: Deep tendon reflexes are symmetric and normal bilaterally. Toes are downgoing bilaterally.   Assessment/Plan:  1. Intractable seizures  2. Chronic daily headache, common migraine  3. Current pregnancy  4. History of narcolepsy  5. Morbid obesity  The patient has multiple medical issues. The patient has had frequent seizures off of medications, she had been on Keppra before. We will place her back on Keppra taking 500 mg twice daily. She is on prenatal vitamins. She will undergo an EEG study. She will be placed on amitriptyline for the chronic daily headaches. She will be referred to a sleep physician for her history of narcolepsy. She will follow-up in 3 months.  Marlan Palau. Keith Willis  MD 04/03/2017 11:17 AM  [facility] [address] [place], [place] [id number]  Phone [phone] Fax [phone]

## 2017-04-03 NOTE — Patient Instructions (Signed)
   We will check an EEG study, and get started on a medication for seizures (keppra) and a medication for the headache (amitriptyline).  Elavil (amitriptyline) is an antidepressant medication that has many uses that may include headache, whiplash injuries, or for peripheral neuropathy pain. Side effects may include drowsiness, dry mouth, blurred vision, or constipation. As with any antidepressant medication, worsening depression may occur. If you had any significant side effects, please call our office. The full effects of this medication may take 7-10 days after starting the drug, or going up on the dose.

## 2017-04-05 LAB — OBSTETRIC PANEL, INCLUDING HIV
Antibody Screen: NEGATIVE
BASOS ABS: 0 10*3/uL (ref 0.0–0.2)
Basos: 0 %
EOS (ABSOLUTE): 0.3 10*3/uL (ref 0.0–0.4)
Eos: 3 %
HEMOGLOBIN: 10.9 g/dL — AB (ref 11.1–15.9)
HEP B S AG: NEGATIVE
HIV SCREEN 4TH GENERATION: NONREACTIVE
Hematocrit: 34.5 % (ref 34.0–46.6)
IMMATURE GRANULOCYTES: 0 %
Immature Grans (Abs): 0 10*3/uL (ref 0.0–0.1)
Lymphocytes Absolute: 2.1 10*3/uL (ref 0.7–3.1)
Lymphs: 21 %
MCH: 25.6 pg — ABNORMAL LOW (ref 26.6–33.0)
MCHC: 31.6 g/dL (ref 31.5–35.7)
MCV: 81 fL (ref 79–97)
MONOCYTES: 5 %
Monocytes Absolute: 0.5 10*3/uL (ref 0.1–0.9)
Neutrophils Absolute: 7.3 10*3/uL — ABNORMAL HIGH (ref 1.4–7.0)
Neutrophils: 71 %
PLATELETS: 267 10*3/uL (ref 150–379)
RBC: 4.26 x10E6/uL (ref 3.77–5.28)
RDW: 14.6 % (ref 12.3–15.4)
RPR: NONREACTIVE
RUBELLA: 1.57 {index} (ref >0.99)
Rh Factor: POSITIVE
WBC: 10.3 10*3/uL (ref 3.4–10.8)

## 2017-04-05 LAB — HEMOGLOBINOPATHY EVALUATION
HEMOGLOBIN A2 QUANTITATION: 2.8 % (ref 1.8–3.2)
HGB A: 95.3 % — AB (ref 96.4–98.8)
HGB C: 0 %
HGB S: 0 %
HGB VARIANT: 0 %
Hemoglobin F Quantitation: 1.9 % (ref 0.0–2.0)

## 2017-04-05 LAB — VARICELLA ZOSTER ANTIBODY, IGG: Varicella zoster IgG: 272 index (ref >165)

## 2017-04-05 LAB — CYSTIC FIBROSIS MUTATION 97: Interpretation: NOT DETECTED

## 2017-04-07 ENCOUNTER — Encounter: Payer: Self-pay | Admitting: Neurology

## 2017-04-07 ENCOUNTER — Ambulatory Visit (INDEPENDENT_AMBULATORY_CARE_PROVIDER_SITE_OTHER): Payer: Medicaid Other | Admitting: Neurology

## 2017-04-07 VITALS — BP 125/77 | HR 79 | Resp 18 | Ht 67.0 in | Wt 307.5 lb

## 2017-04-07 DIAGNOSIS — R0683 Snoring: Secondary | ICD-10-CM | POA: Diagnosis not present

## 2017-04-07 DIAGNOSIS — G47411 Narcolepsy with cataplexy: Secondary | ICD-10-CM

## 2017-04-07 DIAGNOSIS — E669 Obesity, unspecified: Secondary | ICD-10-CM

## 2017-04-07 DIAGNOSIS — G4719 Other hypersomnia: Secondary | ICD-10-CM

## 2017-04-07 NOTE — Patient Instructions (Signed)
I believe you may have a condition called narcolepsy: This means, that you have a sleep disorder that manifests with at times severe excessive sleepiness during the day and often with problems with sleep at night. We may have to try different medications that may help you stay awake during the day. Not everything works with everybody the same way. Wake promoting agents include stimulants and non-stimulant type medications. The most common side effects with stimulants are weight loss, insomnia, nervousness, headaches, palpitations, rise in blood pressure, anxiety. Stimulants can be addictive and subject to abuse. Non-stimulant type wake promoting medications include Provigil and Nuvigil, most common side effects include headaches, nervousness, insomnia, hypertension. In addition there is a medication called Xyrem which has been proven to be very effective in patients with narcolepsy with or without cataplexy. Some patients with narcolepsy report episodes of weakness, such as jaw or facial weakness, legs giving out, feeling wobbly or like "Jell-o", etc. in situations of anxiety, stress, laughter, sudden sadness, surprise, etc., which is called cataplexy. You can also experience episodes of sleep paralysis during which you may feel unable to move upon awakening. Some people experience dreamlike sequences upon awakening or upon drifting off to sleep, called hypnopompic or hypnagogic hallucinations.  
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
## 2017-04-08 ENCOUNTER — Encounter: Payer: Self-pay | Admitting: Obstetrics and Gynecology

## 2017-04-08 DIAGNOSIS — Z6841 Body Mass Index (BMI) 40.0 and over, adult: Secondary | ICD-10-CM | POA: Insufficient documentation

## 2017-04-08 DIAGNOSIS — G4730 Sleep apnea, unspecified: Secondary | ICD-10-CM | POA: Insufficient documentation

## 2017-04-08 DIAGNOSIS — O99210 Obesity complicating pregnancy, unspecified trimester: Secondary | ICD-10-CM | POA: Insufficient documentation

## 2017-04-26 ENCOUNTER — Ambulatory Visit (INDEPENDENT_AMBULATORY_CARE_PROVIDER_SITE_OTHER): Payer: Medicaid Other | Admitting: Obstetrics & Gynecology

## 2017-04-26 ENCOUNTER — Encounter: Payer: Self-pay | Admitting: Obstetrics & Gynecology

## 2017-04-26 VITALS — BP 126/79 | HR 82 | Wt 305.0 lb

## 2017-04-26 DIAGNOSIS — O0992 Supervision of high risk pregnancy, unspecified, second trimester: Secondary | ICD-10-CM | POA: Diagnosis not present

## 2017-04-26 DIAGNOSIS — E669 Obesity, unspecified: Secondary | ICD-10-CM | POA: Diagnosis not present

## 2017-04-26 DIAGNOSIS — O99352 Diseases of the nervous system complicating pregnancy, second trimester: Secondary | ICD-10-CM

## 2017-04-26 DIAGNOSIS — G47411 Narcolepsy with cataplexy: Secondary | ICD-10-CM

## 2017-04-26 DIAGNOSIS — O99212 Obesity complicating pregnancy, second trimester: Secondary | ICD-10-CM

## 2017-04-26 DIAGNOSIS — O0990 Supervision of high risk pregnancy, unspecified, unspecified trimester: Secondary | ICD-10-CM

## 2017-04-26 DIAGNOSIS — G4711 Idiopathic hypersomnia with long sleep time: Secondary | ICD-10-CM

## 2017-04-26 DIAGNOSIS — Z3490 Encounter for supervision of normal pregnancy, unspecified, unspecified trimester: Secondary | ICD-10-CM

## 2017-04-26 NOTE — Patient Instructions (Signed)

## 2017-04-26 NOTE — Progress Notes (Signed)
Pt presents for ROB w/o concerns today.

## 2017-04-26 NOTE — Progress Notes (Signed)
   PRENATAL VISIT NOTE  Subjective:  Heather Ward is a 26 y.o. G3P2002 at 3225w0d being seen today for ongoing prenatal care.  She is currently monitored for the following issues for this high-risk pregnancy and has Supervision of high-risk pregnancy; Seizure (HCC); Narcolepsy; Common migraine with intractable migraine; BMI 45.0-49.9, adult (HCC); Obesity in pregnancy; and Sleep apnea on her problem list.  Patient reports heartburn and nausea.  Contractions: Not present. Vag. Bleeding: None.   . Denies leaking of fluid.   The following portions of the patient's history were reviewed and updated as appropriate: allergies, current medications, past family history, past medical history, past social history, past surgical history and problem list. Problem list updated.  Objective:   Vitals:   04/26/17 0815  BP: 126/79  Pulse: 82  Weight: (!) 305 lb (138.3 kg)    Fetal Status: Fetal Heart Rate (bpm): 152         General:  Alert, oriented and cooperative. Patient is in no acute distress.  Skin: Skin is warm and dry. No rash noted.   Cardiovascular: Normal heart rate noted  Respiratory: Normal respiratory effort, no problems with respiration noted  Abdomen: Soft, gravid, appropriate for gestational age. Pain/Pressure: Present     Pelvic:  Cervical exam deferred        Extremities: Normal range of motion.  Edema: None  Mental Status: Normal mood and affect. Normal behavior. Normal judgment and thought content.   Assessment and Plan:  Pregnancy: G3P2002 at 7525w0d  1. Encounter for supervision of normal pregnancy, antepartum, unspecified gravidity A1C due to increased DM risk  2. Primary narcolepsy with cataplexy Keppra  3. Supervision of high risk pregnancy, antepartum  - US MFM OB COMP + 14 WK; Future - Hemoglobin A1c  Preterm labor symptoms and general obstetric precautions including but not limited to vaginal bleeding, contractions, leaking of fluid and fetal movement were reviewed  in detail with the patient. Please refer to After Visit Summary for other counseling recommendations.  Return in about 4 weeks (around 05/24/2017).   Scheryl DarterJames Clarity Ciszek, MD

## 2017-04-27 ENCOUNTER — Other Ambulatory Visit: Payer: Medicaid Other

## 2017-04-27 LAB — HEMOGLOBIN A1C
Est. average glucose Bld gHb Est-mCnc: 108 mg/dL
Hgb A1c MFr Bld: 5.4 % (ref 4.8–5.6)

## 2017-05-19 ENCOUNTER — Inpatient Hospital Stay (HOSPITAL_COMMUNITY)
Admission: AD | Admit: 2017-05-19 | Discharge: 2017-05-19 | Disposition: A | Discharge status: Home / Self Care | Payer: Medicaid Other | Source: Ambulatory Visit | Attending: Obstetrics and Gynecology | Admitting: Obstetrics and Gynecology

## 2017-05-19 ENCOUNTER — Encounter (HOSPITAL_COMMUNITY): Payer: Self-pay

## 2017-05-19 DIAGNOSIS — O9989 Other specified diseases and conditions complicating pregnancy, childbirth and the puerperium: Secondary | ICD-10-CM | POA: Diagnosis not present

## 2017-05-19 DIAGNOSIS — R109 Unspecified abdominal pain: Secondary | ICD-10-CM | POA: Diagnosis present

## 2017-05-19 DIAGNOSIS — K529 Noninfective gastroenteritis and colitis, unspecified: Secondary | ICD-10-CM | POA: Diagnosis not present

## 2017-05-19 DIAGNOSIS — O99612 Diseases of the digestive system complicating pregnancy, second trimester: Secondary | ICD-10-CM | POA: Diagnosis not present

## 2017-05-19 DIAGNOSIS — Z3A17 17 weeks gestation of pregnancy: Secondary | ICD-10-CM | POA: Diagnosis not present

## 2017-05-19 DIAGNOSIS — Z91040 Latex allergy status: Secondary | ICD-10-CM | POA: Insufficient documentation

## 2017-05-19 DIAGNOSIS — Z3492 Encounter for supervision of normal pregnancy, unspecified, second trimester: Secondary | ICD-10-CM

## 2017-05-19 HISTORY — DX: Unspecified abnormal cytological findings in specimens from vagina: R87.629

## 2017-05-19 HISTORY — DX: Anemia, unspecified: D64.9

## 2017-05-19 LAB — URINALYSIS, ROUTINE W REFLEX MICROSCOPIC
Bilirubin Urine: NEGATIVE
GLUCOSE, UA: NEGATIVE mg/dL
KETONES UR: NEGATIVE mg/dL
NITRITE: NEGATIVE
PH: 5 (ref 5.0–8.0)
Protein, ur: 30 mg/dL — AB
Specific Gravity, Urine: 1.024 (ref 1.005–1.030)

## 2017-05-19 LAB — CBC WITH DIFFERENTIAL/PLATELET
BASOS PCT: 0 %
Basophils Absolute: 0 10*3/uL (ref 0.0–0.1)
EOS PCT: 2 %
Eosinophils Absolute: 0.2 10*3/uL (ref 0.0–0.7)
HEMATOCRIT: 32 % — AB (ref 36.0–46.0)
Hemoglobin: 10.3 g/dL — ABNORMAL LOW (ref 12.0–15.0)
Lymphocytes Relative: 18 %
Lymphs Abs: 1.5 10*3/uL (ref 0.7–4.0)
MCH: 25.4 pg — ABNORMAL LOW (ref 26.0–34.0)
MCHC: 32.2 g/dL (ref 30.0–36.0)
MCV: 78.8 fL (ref 78.0–100.0)
MONO ABS: 0.4 10*3/uL (ref 0.1–1.0)
MONOS PCT: 4 %
NEUTROS ABS: 6.7 10*3/uL (ref 1.7–7.7)
Neutrophils Relative %: 76 %
PLATELETS: 205 10*3/uL (ref 150–400)
RBC: 4.06 MIL/uL (ref 3.87–5.11)
RDW: 14.1 % (ref 11.5–15.5)
WBC: 8.8 10*3/uL (ref 4.0–10.5)

## 2017-05-19 LAB — WET PREP, GENITAL
Clue Cells Wet Prep HPF POC: NONE SEEN
Sperm: NONE SEEN
Trich, Wet Prep: NONE SEEN
Yeast Wet Prep HPF POC: NONE SEEN

## 2017-05-19 LAB — BASIC METABOLIC PANEL
Anion gap: 8 (ref 5–15)
BUN: 6 mg/dL (ref 6–20)
CALCIUM: 9 mg/dL (ref 8.9–10.3)
CO2: 21 mmol/L — AB (ref 22–32)
CREATININE: 0.56 mg/dL (ref 0.44–1.00)
Chloride: 105 mmol/L (ref 101–111)
GFR calc Af Amer: >60 mL/min (ref >60)
GFR calc non Af Amer: >60 mL/min (ref >60)
GLUCOSE: 106 mg/dL — AB (ref 65–99)
Potassium: 3.4 mmol/L — ABNORMAL LOW (ref 3.5–5.1)
Sodium: 134 mmol/L — ABNORMAL LOW (ref 135–145)

## 2017-05-19 MED ORDER — ONDANSETRON 8 MG PO TBDP
8.0000 mg | ORAL_TABLET | Freq: Once | ORAL | Status: AC
  Administered 2017-05-19: 8 mg via ORAL
  Filled 2017-05-19: qty 1

## 2017-05-19 MED ORDER — ONDANSETRON 4 MG PO TBDP: 4.0000 mg | ORAL_TABLET | Freq: Three times a day (TID) | ORAL | 0 refills | Status: DC | PRN

## 2017-05-19 NOTE — MAU Provider Note (Signed)
History     CSN: [id number]  Arrival date and time: 05/19/17 1034  First Provider Initiated Contact with Patient 05/19/17 1151      Chief Complaint  Patient presents with  . Abdominal Pain  . Emesis  . Diarrhea   HPI Hanley HaysHattie Arriola is a 26 y.o. G3P2002 at [redacted]w[redacted]d who presents with n/v/d & abdominal cramping. Symptoms began 4 days. Vomiting has improved; vomited twice today. Stool is not as watery today, loose stools x 6 in the last 24 hours. Endorses lower abdominal cramping that she rates 7/10. Has not treated pain. Denies fever/chills, sick contacts, recent abx, recent hospitalization. Reports vaginal irritation as well. Denies vaginal bleeding, LOF, or vaginal discharge.   OB History    Gravida Para Term Preterm AB Living   3 2 2  0 0 2   SAB TAB Ectopic Multiple Live Births   0 0 0 0 2      Past Medical History:  Diagnosis Date  . Anemia   . Common migraine with intractable migraine 04/03/2017  . Dyspnea   . Epilepsy (HCC)   . Morbid obesity (HCC) 04/03/2017  . Narcolepsy   . Ovarian cyst   . Sleep apnea   . Vaginal Pap smear, abnormal     Past Surgical History:  Procedure Laterality Date  . NO PAST SURGERIES      Family History  Problem Relation Age of Onset  . Arthritis Mother   . Hypertension Mother   . Asthma Father   . Diabetes Father   . Drug abuse Father   . Hypertension Father   . Stroke Father   . Asthma Sister   . Diabetes Sister   . Hypertension Sister   . Miscarriages / Stillbirths Sister   . Mental illness Paternal Aunt   . Cancer Paternal Grandmother     Social History  Substance Use Topics  . Smoking status: Never Smoker  . Smokeless tobacco: Never Used  . Alcohol use No    Allergies:  Allergies  Allergen Reactions  . Iodine Itching  . Latex Rash    Prescriptions Prior to Admission  Medication Sig Dispense Refill Last Dose  . amitriptyline (ELAVIL) 10 MG tablet Take one tablet at night for one week, then take 2 tablets at night  for one week, then take 3 tablets at night. 90 tablet 3 05/18/2017 at Unknown time  . levETIRAcetam (KEPPRA) 500 MG tablet Take 1 tablet (500 mg total) by mouth 2 (two) times daily. 60 tablet 5 05/18/2017 at Unknown time  . Prenatal Vit-Fe Fumarate-FA (PRENATAL MULTIVITAMIN) TABS tablet Take 1 tablet by mouth daily at 12 noon. 30 tablet 12 05/18/2017 at Unknown time    Review of Systems  Constitutional: Negative.   Gastrointestinal: Positive for abdominal pain, diarrhea, nausea and vomiting. Negative for anal bleeding, blood in stool and constipation.  Genitourinary: Negative for dysuria, vaginal bleeding and vaginal discharge.       +vaginal irritation   Physical Exam   Blood pressure 115/67, pulse 87, temperature 98.2 F (36.8 C), temperature source Oral, resp. rate 18, weight (!) 302 lb 8 oz (137.2 kg), last menstrual period 12/19/2016, SpO2 100 %.  Physical Exam  Nursing note and vitals reviewed. Constitutional: She is oriented to person, place, and time. She appears well-developed and well-nourished. No distress.  HENT:  Head: Normocephalic and atraumatic.  Eyes: Conjunctivae are normal. Right eye exhibits no discharge. Left eye exhibits no discharge. No scleral icterus.  Neck: Normal range of motion.  Cardiovascular: Normal rate, regular rhythm and normal heart sounds.   No murmur heard. Respiratory: Effort normal and breath sounds normal. No respiratory distress. She has no wheezes.  GI: Soft. Bowel sounds are normal. There is no tenderness.  Genitourinary: There is no rash on the right labia. There is no rash on the left labia. No erythema in the vagina.  Genitourinary Comments: Cervix closed/thick  Neurological: She is alert and oriented to person, place, and time.  Skin: Skin is warm and dry. She is not diaphoretic.  Psychiatric: She has a normal mood and affect. Her behavior is normal. Judgment and thought content normal.      MAU Course  Procedures Results for orders placed  or performed during the hospital encounter of 05/19/17 (from the past 24 hour(s))  Urinalysis, Routine w reflex microscopic     Status: Abnormal   Collection Time: 05/19/17 10:55 AM  Result Value Ref Range   Color, Urine YELLOW YELLOW   APPearance CLOUDY (A) CLEAR   Specific Gravity, Urine 1.024 1.005 - 1.030   pH 5.0 5.0 - 8.0   Glucose, UA NEGATIVE NEGATIVE mg/dL   Hgb urine dipstick SMALL (A) NEGATIVE   Bilirubin Urine NEGATIVE NEGATIVE   Ketones, ur NEGATIVE NEGATIVE mg/dL   Protein, ur 30 (A) NEGATIVE mg/dL   Nitrite NEGATIVE NEGATIVE   Leukocytes, UA SMALL (A) NEGATIVE   RBC / HPF 0-5 0 - 5 RBC/hpf   WBC, UA 0-5 0 - 5 WBC/hpf   Bacteria, UA RARE (A) NONE SEEN   Squamous Epithelial / LPF 6-30 (A) NONE SEEN   Mucous PRESENT   CBC with Differential/Platelet     Status: Abnormal   Collection Time: 05/19/17 12:22 PM  Result Value Ref Range   WBC 8.8 4.0 - 10.5 K/uL   RBC 4.06 3.87 - 5.11 MIL/uL   Hemoglobin 10.3 (L) 12.0 - 15.0 g/dL   HCT 16.1 (L) 09.6 - 04.5 %   MCV 78.8 78.0 - 100.0 fL   MCH 25.4 (L) 26.0 - 34.0 pg   MCHC 32.2 30.0 - 36.0 g/dL   RDW 40.9 81.1 - 91.4 %   Platelets 205 150 - 400 K/uL   Neutrophils Relative % 76 %   Neutro Abs 6.7 1.7 - 7.7 K/uL   Lymphocytes Relative 18 %   Lymphs Abs 1.5 0.7 - 4.0 K/uL   Monocytes Relative 4 %   Monocytes Absolute 0.4 0.1 - 1.0 K/uL   Eosinophils Relative 2 %   Eosinophils Absolute 0.2 0.0 - 0.7 K/uL   Basophils Relative 0 %   Basophils Absolute 0.0 0.0 - 0.1 K/uL  Basic metabolic panel     Status: Abnormal   Collection Time: 05/19/17 12:22 PM  Result Value Ref Range   Sodium 134 (L) 135 - 145 mmol/L   Potassium 3.4 (L) 3.5 - 5.1 mmol/L   Chloride 105 101 - 111 mmol/L   CO2 21 (L) 22 - 32 mmol/L   Glucose, Bld 106 (H) 65 - 99 mg/dL   BUN 6 6 - 20 mg/dL   Creatinine, Ser 7.82 0.44 - 1.00 mg/dL   Calcium 9.0 8.9 - 95.6 mg/dL   GFR calc non Af Amer >60 >60 mL/min   GFR calc Af Amer >60 >60 mL/min   Anion gap 8 5  - 15  Wet prep, genital     Status: Abnormal   Collection Time: 05/19/17  1:00 PM  Result Value Ref Range   Yeast Wet Prep HPF POC  NONE SEEN NONE SEEN   Trich, Wet Prep NONE SEEN NONE SEEN   Clue Cells Wet Prep HPF POC NONE SEEN NONE SEEN   WBC, Wet Prep HPF POC FEW (A) NONE SEEN   Sperm NONE SEEN     MDM FHT 145 VSS, NAD PO zofran   Cervix closed Wet prep & GC/CT  Assessment and Plan  A: 1. Acute gastroenteritis   2. Fetal heart tones present, second trimester    P: Discharge home Rx zofran Brat diet for diarrhea Discussed reasons to return to MAU Keep f/u with OB  Judeth Horn 05/19/2017, 11:50 AM

## 2017-05-19 NOTE — Discharge Instructions (Signed)

## 2017-05-19 NOTE — MAU Note (Signed)
Been having vomiting and diarrhea for the past 4 days. Vomiting has slowed, but the diarrhea has continued. (does have substance, no one else is sick).  Been having pain and pressure in her lower abd,  When she stands, walks or turns.  when she turns- something pops

## 2017-05-22 LAB — GC/CHLAMYDIA PROBE AMP (~~LOC~~) NOT AT ARMC
Chlamydia: NEGATIVE
NEISSERIA GONORRHEA: NEGATIVE

## 2017-05-24 ENCOUNTER — Encounter: Payer: Medicaid Other | Admitting: Obstetrics & Gynecology

## 2017-05-29 ENCOUNTER — Ambulatory Visit (INDEPENDENT_AMBULATORY_CARE_PROVIDER_SITE_OTHER): Payer: Medicaid Other | Admitting: Certified Nurse Midwife

## 2017-05-29 VITALS — BP 118/69 | HR 73 | Wt 306.3 lb

## 2017-05-29 DIAGNOSIS — E669 Obesity, unspecified: Secondary | ICD-10-CM

## 2017-05-29 DIAGNOSIS — O0990 Supervision of high risk pregnancy, unspecified, unspecified trimester: Secondary | ICD-10-CM

## 2017-05-29 DIAGNOSIS — O99212 Obesity complicating pregnancy, second trimester: Secondary | ICD-10-CM

## 2017-05-29 DIAGNOSIS — G47411 Narcolepsy with cataplexy: Secondary | ICD-10-CM

## 2017-05-29 DIAGNOSIS — O0992 Supervision of high risk pregnancy, unspecified, second trimester: Secondary | ICD-10-CM

## 2017-05-29 DIAGNOSIS — O99210 Obesity complicating pregnancy, unspecified trimester: Secondary | ICD-10-CM

## 2017-05-29 NOTE — Progress Notes (Signed)
Subjective:  Heather Ward is a 26 y.o. G3P2002 at 2613w5d being seen today for ongoing prenatal care.  She is currently monitored for the following issues for this high-risk pregnancy and has Supervision of high-risk pregnancy; Seizure (HCC); Narcolepsy; Common migraine with intractable migraine; BMI 45.0-49.9, adult (HCC); Obesity in pregnancy; and Sleep apnea on her problem list.  Patient reports no complaints.  Contractions: Not present. Vag. Bleeding: None.  Movement: Present. Denies leaking of fluid.   The following portions of the patient's history were reviewed and updated as appropriate: allergies, current medications, past family history, past medical history, past social history, past surgical history and problem list. Problem list updated.  Objective:   Vitals:   05/29/17 1011  BP: 118/69  Pulse: 73  Weight: (!) 306 lb 4.8 oz (138.9 kg)    Fetal Status: Fetal Heart Rate (bpm): 155   Movement: Present     General:  Alert, oriented and cooperative. Patient is in no acute distress.  Skin: Skin is warm and dry. No rash noted.   Cardiovascular: Normal heart rate noted  Respiratory: Normal respiratory effort, no problems with respiration noted  Abdomen: Soft, gravid, appropriate for gestational age. Pain/Pressure: Present     Pelvic: Vag. Bleeding: None Vag D/C Character: Thin   Cervical exam deferred        Extremities: Normal range of motion.  Edema: None  Mental Status: Normal mood and affect. Normal behavior. Normal judgment and thought content.   Urinalysis:      Assessment and Plan:  Pregnancy: G3P2002 at 9613w5d  1. Supervision of high risk pregnancy, antepartum - was hopeful for waterbirth but notified not a candidate d/t seizure disorder  2. Primary narcolepsy with cataplexy - followed by Neuro - sleep study upcoming  3. Obesity in pregnancy - nml A1C  Preterm labor symptoms and general obstetric precautions including but not limited to vaginal bleeding,  contractions, leaking of fluid and fetal movement were reviewed in detail with the patient. Please refer to After Visit Summary for other counseling recommendations.  Return in about 4 weeks (around 06/26/2017).   Donette LarryBhambri, Salman Wellen, CNM

## 2017-05-29 NOTE — Progress Notes (Signed)
ROB presents w/o c/o today. Anatomy scheduled 05/31/18.

## 2017-05-31 ENCOUNTER — Ambulatory Visit (HOSPITAL_COMMUNITY): Payer: Medicaid Other

## 2017-06-08 ENCOUNTER — Inpatient Hospital Stay (HOSPITAL_COMMUNITY)
Admission: AD | Admit: 2017-06-08 | Discharge: 2017-06-08 | Disposition: A | Discharge status: Home / Self Care | Payer: Medicaid Other | Source: Ambulatory Visit | Attending: Obstetrics and Gynecology | Admitting: Obstetrics and Gynecology

## 2017-06-08 ENCOUNTER — Encounter (HOSPITAL_COMMUNITY): Payer: Self-pay

## 2017-06-08 DIAGNOSIS — O26899 Other specified pregnancy related conditions, unspecified trimester: Secondary | ICD-10-CM

## 2017-06-08 DIAGNOSIS — Z6841 Body Mass Index (BMI) 40.0 and over, adult: Secondary | ICD-10-CM | POA: Diagnosis not present

## 2017-06-08 DIAGNOSIS — O99212 Obesity complicating pregnancy, second trimester: Secondary | ICD-10-CM | POA: Insufficient documentation

## 2017-06-08 DIAGNOSIS — R1030 Lower abdominal pain, unspecified: Secondary | ICD-10-CM | POA: Insufficient documentation

## 2017-06-08 DIAGNOSIS — Z3A20 20 weeks gestation of pregnancy: Secondary | ICD-10-CM

## 2017-06-08 DIAGNOSIS — O26892 Other specified pregnancy related conditions, second trimester: Secondary | ICD-10-CM | POA: Diagnosis not present

## 2017-06-08 DIAGNOSIS — R42 Dizziness and giddiness: Secondary | ICD-10-CM | POA: Diagnosis not present

## 2017-06-08 DIAGNOSIS — R102 Pelvic and perineal pain: Secondary | ICD-10-CM

## 2017-06-08 LAB — URINALYSIS, ROUTINE W REFLEX MICROSCOPIC
Bilirubin Urine: NEGATIVE
GLUCOSE, UA: NEGATIVE mg/dL
Hgb urine dipstick: NEGATIVE
Ketones, ur: NEGATIVE mg/dL
LEUKOCYTES UA: NEGATIVE
Nitrite: NEGATIVE
PH: 5 (ref 5.0–8.0)
Protein, ur: NEGATIVE mg/dL
SPECIFIC GRAVITY, URINE: 1.023 (ref 1.005–1.030)

## 2017-06-08 LAB — CBC
HEMATOCRIT: 31.4 % — AB (ref 36.0–46.0)
HEMOGLOBIN: 10 g/dL — AB (ref 12.0–15.0)
MCH: 25.1 pg — ABNORMAL LOW (ref 26.0–34.0)
MCHC: 31.8 g/dL (ref 30.0–36.0)
MCV: 78.9 fL (ref 78.0–100.0)
Platelets: 233 10*3/uL (ref 150–400)
RBC: 3.98 MIL/uL (ref 3.87–5.11)
RDW: 14.5 % (ref 11.5–15.5)
WBC: 11.2 10*3/uL — ABNORMAL HIGH (ref 4.0–10.5)

## 2017-06-08 LAB — WET PREP, GENITAL
Clue Cells Wet Prep HPF POC: NONE SEEN
SPERM: NONE SEEN
Trich, Wet Prep: NONE SEEN
Yeast Wet Prep HPF POC: NONE SEEN

## 2017-06-08 LAB — GLUCOSE, CAPILLARY: GLUCOSE-CAPILLARY: 94 mg/dL (ref 65–99)

## 2017-06-08 MED ORDER — IBUPROFEN 600 MG PO TABS: 600.0000 mg | ORAL_TABLET | Freq: Four times a day (QID) | ORAL | 1 refills | Status: DC | PRN

## 2017-06-08 MED ORDER — IBUPROFEN 600 MG PO TABS
600.0000 mg | ORAL_TABLET | Freq: Once | ORAL | Status: AC
  Administered 2017-06-08: 600 mg via ORAL
  Filled 2017-06-08: qty 1

## 2017-06-08 NOTE — MAU Note (Signed)
Pt c/o lower abdominal pain that radiates into upper abdomen that started last night. States she was spotting last night, but has stopped. States she took tylenol last night-helped some, but did not take any today.

## 2017-06-08 NOTE — Discharge Instructions (Signed)
Iron-Rich Diet Iron is a mineral that helps your body to produce hemoglobin. Hemoglobin is a protein in your red blood cells that carries oxygen to your body's tissues. Eating too little iron may cause you to feel weak and tired, and it can increase your risk for infection. Eating enough iron is necessary for your body's metabolism, muscle function, and nervous system. Iron is naturally found in many foods. It can also be added to foods or fortified in foods. There are two types of dietary iron:  Heme iron. Heme iron is absorbed by the body more easily than nonheme iron. Heme iron is found in meat, poultry, and fish.  Nonheme iron. Nonheme iron is found in dietary supplements, iron-fortified grains, beans, and vegetables.  You may need to follow an iron-rich diet if:  You have been diagnosed with iron deficiency or iron-deficiency anemia.  You have a condition that prevents you from absorbing dietary iron, such as: ? Infection in your intestines. ? Celiac disease. This involves long-lasting (chronic) inflammation of your intestines.  You do not eat enough iron.  You eat a diet that is high in foods that impair iron absorption.  You have lost a lot of blood.  You have heavy bleeding during your menstrual cycle.  You are pregnant.  What is my plan? Your health care provider may help you to determine how much iron you need per day based on your condition. Generally, when a person consumes sufficient amounts of iron in the diet, the following iron needs are met:  Men. ? 66-61 years old: 11 mg per day. ? 16-18 years old: 8 mg per day.  Women. ? 50-5 years old: 15 mg per day. ? 47-2 years old: 18 mg per day. ? Over 43 years old: 8 mg per day. ? Pregnant women: 27 mg per day. ? Breastfeeding women: 9 mg per day.  What do I need to know about an iron-rich diet?  Eat fresh fruits and vegetables that are high in vitamin C along with foods that are high in iron. This will help  increase the amount of iron that your body absorbs from food, especially with foods containing nonheme iron. Foods that are high in vitamin C include oranges, peppers, tomatoes, and mango.  Take iron supplements only as directed by your health care provider. Overdose of iron can be life-threatening. If you were prescribed iron supplements, take them with orange juice or a vitamin C supplement.  Cook foods in pots and pans that are made from iron.  Eat nonheme iron-containing foods alongside foods that are high in heme iron. This helps to improve your iron absorption.  Certain foods and drinks contain compounds that impair iron absorption. Avoid eating these foods in the same meal as iron-rich foods or with iron supplements. These include: ? Coffee, black tea, and red wine. ? Milk, dairy products, and foods that are high in calcium. ? Beans, soybeans, and peas. ? Whole grains.  When eating foods that contain both nonheme iron and compounds that impair iron absorption, follow these tips to absorb iron better. ? Soak beans overnight before cooking. ? Soak whole grains overnight and drain them before using. ? Ferment flours before baking, such as using yeast in bread dough. What foods can I eat? Grains Iron-fortified breakfast cereal. Iron-fortified whole-wheat bread. Enriched rice. Sprouted grains. Vegetables Spinach. Potatoes with skin. Green peas. Broccoli. Red and green bell peppers. Fermented vegetables. Fruits Prunes. Raisins. Oranges. Strawberries. Mango. Grapefruit. Meats and Other Protein Sources  Beef liver. Oysters. Beef. Shrimp. [place]. Chicken. [place]. Sardines. Chickpeas. Nuts. Tofu. Beverages Tomato juice. Fresh orange juice. Prune juice. Hibiscus tea. Fortified instant breakfast shakes. Condiments Tahini. Fermented soy sauce. Sweets and Desserts Black-strap molasses. Other Wheat germ. The items listed above may not be a complete list of recommended foods or beverages.  Contact your dietitian for more options. What foods are not recommended? Grains Whole grains. Bran cereal. Bran flour. Oats. Vegetables Artichokes. Brussels sprouts. Kale. Fruits Blueberries. Raspberries. Strawberries. Figs. Meats and Other Protein Sources Soybeans. Products made from soy protein. Dairy Milk. Cream. Cheese. Yogurt. Cottage cheese. Beverages Coffee. Black tea. Red wine. Sweets and Desserts Cocoa. Chocolate. Ice cream. Other Basil. Oregano. Parsley. The items listed above may not be a complete list of foods and beverages to avoid. Contact your dietitian for more information. This information is not intended to replace advice given to you by your health care provider. Make sure you discuss any questions you have with your health care provider. Document Released: 06/14/2005 Document Revised: 05/20/2016 Document Reviewed: 05/28/2014 Elsevier Interactive Patient Education  2018 [place] for Pregnant Women While you are pregnant, your body will require additional nutrition to help support your growing baby. It is recommended that you consume:  150 additional calories each day during your first trimester.  300 additional calories each day during your second trimester.  300 additional calories each day during your third trimester.  Eating a healthy, well-balanced diet is very important for your health and for your baby's health. You also have a higher need for some vitamins and minerals, such as folic acid, calcium, iron, and vitamin D. What do I need to know about eating during pregnancy?  Do not try to lose weight or go on a diet during pregnancy.  Choose healthy, nutritious foods. Choose  of a sandwich with a glass of milk instead of a candy bar or a high-calorie sugar-sweetened beverage.  Limit your overall intake of foods that have "empty calories." These are foods that have little nutritional value, such as sweets, desserts, candies,  sugar-sweetened beverages, and fried foods.  Eat a variety of foods, especially fruits and vegetables.  Take a prenatal vitamin to help meet the additional needs during pregnancy, specifically for folic acid, iron, calcium, and vitamin D.  Remember to stay active. Ask your health care provider for exercise recommendations that are specific to you.  Practice good food safety and cleanliness, such as washing your hands before you eat and after you prepare raw meat. This helps to prevent foodborne illnesses, such as listeriosis, that can be very dangerous for your baby. Ask your health care provider for more information about listeriosis. What does 150 extra calories look like? Healthy options for an additional 150 calories each day could be any of the following:  Plain low-fat yogurt (6-8 oz) with  cup of berries.  1 apple with 2 teaspoons of peanut butter.  Cut-up vegetables with  cup of hummus.  Low-fat chocolate milk (8 oz or 1 cup).  1 string cheese with 1 medium orange.   of a peanut butter and jelly sandwich on whole-wheat bread (1 tsp of peanut butter).  For 300 calories, you could eat two of those healthy options each day. What is a healthy amount of weight to gain? The recommended amount of weight for you to gain is based on your pre-pregnancy BMI. If your pre-pregnancy BMI was:  Less than 18 (underweight), you should gain 28-40 lb.  18-24.9 (normal), you should  gain 25-35 lb.  25-29.9 (overweight), you should gain 15-25 lb.  Greater than 30 (obese), you should gain 11-20 lb.  What if I am having twins or multiples? Generally, pregnant women who will be having twins or multiples may need to increase their daily calories by 300-600 calories each day. The recommended range for total weight gain is 25-54 lb, depending on your pre-pregnancy BMI. Talk with your health care provider for specific guidance about additional nutritional needs, weight gain, and exercise during  your pregnancy. What foods can I eat? Grains Any grains. Try to choose whole grains, such as whole-wheat bread, oatmeal, or brown rice. Vegetables Any vegetables. Try to eat a variety of colors and types of vegetables to get a full range of vitamins and minerals. Remember to wash your vegetables well before eating. Fruits Any fruits. Try to eat a variety of colors and types of fruit to get a full range of vitamins and minerals. Remember to wash your fruits well before eating. Meats and Other Protein Sources Lean meats, including chicken, [place], fish, and lean cuts of beef, veal, or pork. Make sure that all meats are cooked to "well done." Tofu. Tempeh. Beans. Eggs. Peanut butter and other nut butters. Seafood, such as shrimp, crab, and lobster. If you choose fish, select types that are higher in omega-3 fatty acids, including salmon, herring, mussels, trout, sardines, and pollock. Make sure that all meats are cooked to food-safe temperatures. Dairy Pasteurized milk and milk alternatives. Pasteurized yogurt and pasteurized cheese. Cottage cheese. Sour cream. Beverages Water. Juices that contain 100% fruit juice or vegetable juice. Caffeine-free teas and decaffeinated coffee. Drinks that contain caffeine are okay to drink, but it is better to avoid caffeine. Keep your total caffeine intake to less than 200 mg each day (12 oz of coffee, tea, or soda) or as directed by your health care provider. Condiments Any pasteurized condiments. Sweets and Desserts Any sweets and desserts. Fats and Oils Any fats and oils. The items listed above may not be a complete list of recommended foods or beverages. Contact your dietitian for more options. What foods are not recommended? Vegetables Unpasteurized (raw) vegetable juices. Fruits Unpasteurized (raw) fruit juices. Meats and Other Protein Sources Cured meats that have nitrates, such as bacon, salami, and hotdogs. Luncheon meats, bologna, or other deli  meats (unless they are reheated until they are steaming hot). Refrigerated pate, meat spreads from a meat counter, smoked seafood that is found in the refrigerated section of a store. Raw fish, such as sushi or sashimi. High mercury content fish, such as tilefish, shark, swordfish, and king mackerel. Raw meats, such as tuna or beef tartare. Undercooked meats and poultry. Make sure that all meats are cooked to food-safe temperatures. Dairy Unpasteurized (raw) milk and any foods that have raw milk in them. Soft cheeses, such as feta, queso blanco, queso fresco, Brie, Camembert cheeses, blue-veined cheeses, and Panela cheese (unless it is made with pasteurized milk, which must be stated on the label). Beverages Alcohol. Sugar-sweetened beverages, such as sodas, teas, or energy drinks. Condiments Homemade fermented foods and drinks, such as pickles, sauerkraut, or kombucha drinks. (Store-bought pasteurized versions of these are okay.) Other Salads that are made in the store, such as ham salad, chicken salad, egg salad, tuna salad, and seafood salad. The items listed above may not be a complete list of foods and beverages to avoid. Contact your dietitian for more information. This information is not intended to replace advice given to you by your  health care provider. Make sure you discuss any questions you have with your health care provider. Document Released: 08/15/2014 Document Revised: 04/07/2016 Document Reviewed: 04/15/2014 Elsevier Interactive Patient Education  2018 [employer]. Round Ligament Pain The round ligament is a cord of muscle and tissue that helps to support the uterus. It can become a source of pain during pregnancy if it becomes stretched or twisted as the baby grows. The pain usually begins in the second trimester of pregnancy, and it can come and go until the baby is delivered. It is not a serious problem, and it does not cause harm to the baby. Round ligament pain is usually a  short, sharp, and pinching pain, but it can also be a dull, lingering, and aching pain. The pain is felt in the lower side of the abdomen or in the groin. It usually starts deep in the groin and moves up to the outside of the hip area. Pain can occur with:  A sudden change in position.  Rolling over in bed.  Coughing or sneezing.  Physical activity.  Follow these instructions at home: Watch your condition for any changes. Take these steps to help with your pain:  When the pain starts, relax. Then try: ? Sitting down. ? Flexing your knees up to your abdomen. ? Lying on your side with one pillow under your abdomen and another pillow between your legs. ? Sitting in a warm bath for 15-20 minutes or until the pain goes away.  Take over-the-counter and prescription medicines only as told by your health care provider.  Move slowly when you sit and stand.  Avoid long walks if they cause pain.  Stop or lessen your physical activities if they cause pain.  Contact a health care provider if:  Your pain does not go away with treatment.  You feel pain in your back that you did not have before.  Your medicine is not helping. Get help right away if:  You develop a fever or chills.  You develop uterine contractions.  You develop vaginal bleeding.  You develop nausea or vomiting.  You develop diarrhea.  You have pain when you urinate. This information is not intended to replace advice given to you by your health care provider. Make sure you discuss any questions you have with your health care provider. Document Released: 08/09/2008 Document Revised: 04/07/2016 Document Reviewed: 01/07/2015 Elsevier Interactive Patient Education  [employer].

## 2017-06-08 NOTE — MAU Provider Note (Signed)
History     CSN: [id number]  Arrival date and time: 06/08/17 [id number]   First Provider Initiated Contact with Patient 06/08/17 2057     Chief Complaint  Patient presents with  . Abdominal Pain   HPI Heather Ward is a 26 y.o. G3P2002 at 7725w1d who presents with sharp shooting lower abdominal pain. She states it started yesterday and is worse when she rolls over or stands up. She states it feels like pulling on the sides of her lower abdomen. She rates the pain a 6/10 when it happens and tried tylenol with no relief, no pain now. She denies any vaginal bleeding, leaking of fluid or vaginal discharge. She reports fetal movement. She also complains of feeling light headed when she goes from sitting to standing quickly. Has not had any water to drink over the last 2 days.   OB History    Gravida Para Term Preterm AB Living   3 2 2  0 0 2   SAB TAB Ectopic Multiple Live Births   0 0 0 0 2      Past Medical History:  Diagnosis Date  . Anemia   . Common migraine with intractable migraine 04/03/2017  . Dyspnea   . Epilepsy (HCC)   . Morbid obesity (HCC) 04/03/2017  . Narcolepsy   . Ovarian cyst   . Sleep apnea   . Vaginal Pap smear, abnormal     Past Surgical History:  Procedure Laterality Date  . NO PAST SURGERIES      Family History  Problem Relation Age of Onset  . Arthritis Mother   . Hypertension Mother   . Asthma Father   . Diabetes Father   . Drug abuse Father   . Hypertension Father   . Stroke Father   . Asthma Sister   . Diabetes Sister   . Hypertension Sister   . Miscarriages / Stillbirths Sister   . Mental illness Paternal Aunt   . Cancer Paternal Grandmother     Social History  Substance Use Topics  . Smoking status: Never Smoker  . Smokeless tobacco: Never Used  . Alcohol use No    Allergies:  Allergies  Allergen Reactions  . Iodine Itching  . Latex Rash    Prescriptions Prior to Admission  Medication Sig Dispense Refill Last Dose  .  acetaminophen (TYLENOL) 500 MG tablet Take 1,000 mg by mouth every 6 (six) hours as needed for headache.   06/07/2017 at Unknown time  . amitriptyline (ELAVIL) 10 MG tablet Take one tablet at night for one week, then take 2 tablets at night for one week, then take 3 tablets at night. (Patient taking differently: Take 20 mg by mouth at bedtime. Take one tablet at night for one week, then take 2 tablets at night for one week, then take 3 tablets at night.) 90 tablet 3 Past Month at Unknown time  . levETIRAcetam (KEPPRA) 500 MG tablet Take 1 tablet (500 mg total) by mouth 2 (two) times daily. 60 tablet 5 Past Month at Unknown time  . Prenatal Vit-Fe Fumarate-FA (PRENATAL MULTIVITAMIN) TABS tablet Take 1 tablet by mouth daily at 12 noon. 30 tablet 12 06/07/2017 at Unknown time  . ondansetron (ZOFRAN ODT) 4 MG disintegrating tablet Take 1 tablet (4 mg total) by mouth every 8 (eight) hours as needed for nausea or vomiting. (Patient not taking: Reported on 05/29/2017) 15 tablet 0 Not Taking    Review of Systems  Constitutional: Negative.  Negative for chills and fever.  HENT: Negative.   Respiratory: Negative.  Negative for shortness of breath.   Cardiovascular: Negative.  Negative for chest pain.  Gastrointestinal: Positive for abdominal pain. Negative for constipation, diarrhea, nausea and vomiting.  Genitourinary: Negative.  Negative for dysuria, vaginal bleeding and vaginal discharge.  Musculoskeletal: Negative.   Neurological: Positive for light-headedness. Negative for dizziness and headaches.  Psychiatric/Behavioral: Negative.    Physical Exam   Blood pressure 126/65, pulse 90, temperature 98.1 F (36.7 C), temperature source Oral, resp. rate 18, height 5\' 7"  (1.702 m), weight (!) 307 lb (139.3 kg), last menstrual period 12/19/2016, SpO2 100 %.  Orthostatic VS for the past 24 hrs (Last 3 readings):  BP- Lying Pulse- Lying BP- Sitting Pulse- Sitting BP- Standing at 0 minutes Pulse- Standing at 0  minutes BP- Standing at 3 minutes Pulse- Standing at 3 minutes  06/08/17 2203 130/62 76 147/76 74 (!) 154/91 83 128/73 80    Physical Exam  Nursing note and vitals reviewed. Constitutional: She is oriented to person, place, and time. She appears well-developed and well-nourished.  HENT:  Head: Normocephalic and atraumatic.  Eyes: Conjunctivae are normal. No scleral icterus.  Cardiovascular: Normal rate, regular rhythm and normal heart sounds.   Respiratory: Effort normal and breath sounds normal. No respiratory distress.  GI: Soft. She exhibits no distension. There is no tenderness. There is no guarding.  Neurological: She is alert and oriented to person, place, and time.  Skin: Skin is warm and dry.  Psychiatric: She has a normal mood and affect. Her behavior is normal. Judgment and thought content normal.   Dilation: Closed Effacement (%): Thick Cervical Position: Posterior Exam by:: Ma Hillock SNM  Bimanual exam: Cervix 0/long/high, firm, anterior, neg CMT, uterus nontender, adnexa without tenderness, enlargement, or mass  FHT: 150bpm  MAU Course  Procedures Results for orders placed or performed during the hospital encounter of 06/08/17 (from the past 24 hour(s))  Urinalysis, Routine w reflex microscopic     Status: None   Collection Time: 06/08/17  8:03 PM  Result Value Ref Range   Color, Urine YELLOW YELLOW   APPearance CLEAR CLEAR   Specific Gravity, Urine 1.023 1.005 - 1.030   pH 5.0 5.0 - 8.0   Glucose, UA NEGATIVE NEGATIVE mg/dL   Hgb urine dipstick NEGATIVE NEGATIVE   Bilirubin Urine NEGATIVE NEGATIVE   Ketones, ur NEGATIVE NEGATIVE mg/dL   Protein, ur NEGATIVE NEGATIVE mg/dL   Nitrite NEGATIVE NEGATIVE   Leukocytes, UA NEGATIVE NEGATIVE  CBC     Status: Abnormal   Collection Time: 06/08/17  9:11 PM  Result Value Ref Range   WBC 11.2 (H) 4.0 - 10.5 K/uL   RBC 3.98 3.87 - 5.11 MIL/uL   Hemoglobin 10.0 (L) 12.0 - 15.0 g/dL   HCT 40.9 (L) 81.1 - 91.4 %   MCV  78.9 78.0 - 100.0 fL   MCH 25.1 (L) 26.0 - 34.0 pg   MCHC 31.8 30.0 - 36.0 g/dL   RDW 78.2 95.6 - 21.3 %   Platelets 233 150 - 400 K/uL  Wet prep, genital     Status: Abnormal   Collection Time: 06/08/17  9:15 PM  Result Value Ref Range   Yeast Wet Prep HPF POC NONE SEEN NONE SEEN   Trich, Wet Prep NONE SEEN NONE SEEN   Clue Cells Wet Prep HPF POC NONE SEEN NONE SEEN   WBC, Wet Prep HPF POC FEW (A) NONE SEEN   Sperm NONE SEEN   Glucose, capillary  Status: None   Collection Time: 06/08/17  9:20 PM  Result Value Ref Range   Glucose-Capillary 94 65 - 99 mg/dL   MDM UA CBC Wet prep and gc/chlamydia PO hydration Orthostatic vital signs Ibuprofen 600mg  PO- patient reports relief Low suspicion for appendicitis due to location of pain and absence of fever, leukocytosis and GI complaints.  Assessment and Plan   1. Pain of round ligament during pregnancy   2. [redacted] weeks gestation of pregnancy   3. Postural lightheadedness    -Discharge patient home in stable condition -Encouraged patient to increase water intake and eat regular small meals -Encouraged patient to use pregnancy support belt and change positions slowly -[facility] -Tylenol for pain prn -Encouraged to return here or to other Urgent Care/ED if she develops worsening of symptoms, increase in pain, fever, or other concerning symptoms.   Cleone SlimCaroline Neill SNM 06/08/2017, 10:59 PM   I confirm that I have verified the information documented in the student nurse midwife's note and that I have also personally reperformed the physical exam and all medical decision making activities.Pt refused repeat cervical exam. [place] CitizenCaroline is very experienced w/ cervical exams and felt that pt's cervix was long/closed/firm/posterior so I will not push the issue. I recommended that we get US for CL if not improvement or pain w/ rest and Ibuprofen. Pain resolved.  Katrinka BlazingSmith, [place],  CNM 06/09/2017 2:00 AM

## 2017-06-09 LAB — GC/CHLAMYDIA PROBE AMP (~~LOC~~) NOT AT ARMC
Chlamydia: NEGATIVE
Neisseria Gonorrhea: NEGATIVE

## 2017-06-14 ENCOUNTER — Ambulatory Visit (HOSPITAL_COMMUNITY)
Admission: RE | Admit: 2017-06-14 | Discharge: 2017-06-14 | Disposition: A | Discharge status: Home / Self Care | Payer: Medicaid Other | Source: Ambulatory Visit | Attending: Obstetrics & Gynecology | Admitting: Obstetrics & Gynecology

## 2017-06-14 ENCOUNTER — Other Ambulatory Visit: Payer: Self-pay | Admitting: Obstetrics & Gynecology

## 2017-06-14 DIAGNOSIS — Z3A21 21 weeks gestation of pregnancy: Secondary | ICD-10-CM | POA: Insufficient documentation

## 2017-06-14 DIAGNOSIS — Z3689 Encounter for other specified antenatal screening: Secondary | ICD-10-CM

## 2017-06-14 DIAGNOSIS — Z6841 Body Mass Index (BMI) 40.0 and over, adult: Secondary | ICD-10-CM | POA: Diagnosis not present

## 2017-06-14 DIAGNOSIS — O99212 Obesity complicating pregnancy, second trimester: Secondary | ICD-10-CM | POA: Diagnosis not present

## 2017-06-14 DIAGNOSIS — O0990 Supervision of high risk pregnancy, unspecified, unspecified trimester: Secondary | ICD-10-CM

## 2017-06-14 DIAGNOSIS — O0992 Supervision of high risk pregnancy, unspecified, second trimester: Secondary | ICD-10-CM | POA: Insufficient documentation

## 2017-06-26 ENCOUNTER — Inpatient Hospital Stay (HOSPITAL_COMMUNITY): Payer: Medicaid Other

## 2017-06-26 ENCOUNTER — Encounter: Payer: Self-pay | Admitting: Certified Nurse Midwife

## 2017-06-26 ENCOUNTER — Ambulatory Visit (INDEPENDENT_AMBULATORY_CARE_PROVIDER_SITE_OTHER): Payer: Medicaid Other | Admitting: Certified Nurse Midwife

## 2017-06-26 ENCOUNTER — Inpatient Hospital Stay (HOSPITAL_COMMUNITY)
Admission: AD | Admit: 2017-06-26 | Discharge: 2017-06-26 | Disposition: A | Discharge status: Home / Self Care | Payer: Medicaid Other | Source: Ambulatory Visit | Attending: Obstetrics and Gynecology | Admitting: Obstetrics and Gynecology

## 2017-06-26 ENCOUNTER — Encounter (HOSPITAL_COMMUNITY): Payer: Self-pay | Admitting: *Deleted

## 2017-06-26 VITALS — BP 104/67 | HR 81 | Wt 304.0 lb

## 2017-06-26 DIAGNOSIS — Z823 Family history of stroke: Secondary | ICD-10-CM | POA: Insufficient documentation

## 2017-06-26 DIAGNOSIS — O9989 Other specified diseases and conditions complicating pregnancy, childbirth and the puerperium: Secondary | ICD-10-CM | POA: Diagnosis not present

## 2017-06-26 DIAGNOSIS — O0992 Supervision of high risk pregnancy, unspecified, second trimester: Secondary | ICD-10-CM

## 2017-06-26 DIAGNOSIS — Z825 Family history of asthma and other chronic lower respiratory diseases: Secondary | ICD-10-CM | POA: Diagnosis not present

## 2017-06-26 DIAGNOSIS — Z91040 Latex allergy status: Secondary | ICD-10-CM | POA: Insufficient documentation

## 2017-06-26 DIAGNOSIS — Z3A22 22 weeks gestation of pregnancy: Secondary | ICD-10-CM | POA: Insufficient documentation

## 2017-06-26 DIAGNOSIS — Z8261 Family history of arthritis: Secondary | ICD-10-CM | POA: Insufficient documentation

## 2017-06-26 DIAGNOSIS — O9A213 Injury, poisoning and certain other consequences of external causes complicating pregnancy, third trimester: Secondary | ICD-10-CM | POA: Insufficient documentation

## 2017-06-26 DIAGNOSIS — Z833 Family history of diabetes mellitus: Secondary | ICD-10-CM | POA: Insufficient documentation

## 2017-06-26 DIAGNOSIS — Z818 Family history of other mental and behavioral disorders: Secondary | ICD-10-CM | POA: Diagnosis not present

## 2017-06-26 DIAGNOSIS — M545 Low back pain: Secondary | ICD-10-CM | POA: Diagnosis not present

## 2017-06-26 DIAGNOSIS — Z6841 Body Mass Index (BMI) 40.0 and over, adult: Secondary | ICD-10-CM

## 2017-06-26 DIAGNOSIS — Z8249 Family history of ischemic heart disease and other diseases of the circulatory system: Secondary | ICD-10-CM | POA: Insufficient documentation

## 2017-06-26 DIAGNOSIS — Z813 Family history of other psychoactive substance abuse and dependence: Secondary | ICD-10-CM | POA: Insufficient documentation

## 2017-06-26 DIAGNOSIS — Z888 Allergy status to other drugs, medicaments and biological substances status: Secondary | ICD-10-CM | POA: Insufficient documentation

## 2017-06-26 DIAGNOSIS — W19XXXA Unspecified fall, initial encounter: Secondary | ICD-10-CM | POA: Insufficient documentation

## 2017-06-26 DIAGNOSIS — O99212 Obesity complicating pregnancy, second trimester: Secondary | ICD-10-CM | POA: Diagnosis not present

## 2017-06-26 DIAGNOSIS — O99210 Obesity complicating pregnancy, unspecified trimester: Secondary | ICD-10-CM

## 2017-06-26 DIAGNOSIS — Y92009 Unspecified place in unspecified non-institutional (private) residence as the place of occurrence of the external cause: Secondary | ICD-10-CM | POA: Diagnosis not present

## 2017-06-26 DIAGNOSIS — R569 Unspecified convulsions: Secondary | ICD-10-CM

## 2017-06-26 DIAGNOSIS — Z809 Family history of malignant neoplasm, unspecified: Secondary | ICD-10-CM | POA: Diagnosis not present

## 2017-06-26 MED ORDER — OXYCODONE-ACETAMINOPHEN 5-325 MG PO TABS: 1.0000 | ORAL_TABLET | Freq: Four times a day (QID) | ORAL | 0 refills | Status: DC | PRN

## 2017-06-26 NOTE — MAU Provider Note (Signed)
History     CSN: [id number]  Arrival date and time: 06/26/17 [id number]   First Provider Initiated Contact with Patient 06/26/17 2029      Chief Complaint  Patient presents with  . Fall   HPI Ms. Heather Ward is a 26 y.o. G3P2002 at 5710w5d who presents to MAU today with complaint of a fall at home at 1900. The patient states that she slipped on the steps because her shoe started to come off. She landed on her low back and slid down the remaining steps. She feels that since then she has had low back pain and intermittent tightening of her abdomen. She feels this have happened ~ 10 times over the last 2 hours. She feels it is much less frequent and painful now. She denies vaginal bleeding or LOF. She has not taken anything for pain. She feels some fetal movement, but minimal since the fall.   OB History    Gravida Para Term Preterm AB Living   3 2 2  0 0 2   SAB TAB Ectopic Multiple Live Births   0 0 0 0 2      Past Medical History:  Diagnosis Date  . Anemia   . Common migraine with intractable migraine 04/03/2017  . Dyspnea   . Epilepsy (HCC)   . Morbid obesity (HCC) 04/03/2017  . Narcolepsy   . Ovarian cyst   . Sleep apnea   . Vaginal Pap smear, abnormal     Past Surgical History:  Procedure Laterality Date  . NO PAST SURGERIES      Family History  Problem Relation Age of Onset  . Arthritis Mother   . Hypertension Mother   . Asthma Father   . Diabetes Father   . Drug abuse Father   . Hypertension Father   . Stroke Father   . Asthma Sister   . Diabetes Sister   . Hypertension Sister   . Miscarriages / Stillbirths Sister   . Mental illness Paternal Aunt   . Cancer Paternal Grandmother     Social History  Substance Use Topics  . Smoking status: Never Smoker  . Smokeless tobacco: Never Used  . Alcohol use No    Allergies:  Allergies  Allergen Reactions  . Iodine Itching  . Latex Rash    No prescriptions prior to admission.    Review of Systems   Constitutional: Negative for fever.  Gastrointestinal: Positive for abdominal pain. Negative for constipation, diarrhea, nausea and vomiting.  Genitourinary: Negative for vaginal bleeding and vaginal discharge.  Musculoskeletal: Positive for back pain.   Physical Exam   Blood pressure 104/77, pulse 86, temperature 97.8 F (36.6 C), temperature source Oral, resp. rate 16, height 5\' 7"  (1.702 m), weight (!) 304 lb (137.9 kg), last menstrual period 12/19/2016.  Physical Exam  Nursing note and vitals reviewed. Constitutional: She is oriented to person, place, and time. She appears well-developed and well-nourished. No distress.  HENT:  Head: Normocephalic and atraumatic.  Cardiovascular: Normal rate.   Respiratory: Effort normal.  GI: Soft. She exhibits no distension and no mass. There is no tenderness. There is no rebound and no guarding.  Musculoskeletal:       Lumbar back: She exhibits tenderness. She exhibits normal range of motion, no bony tenderness, no swelling, no edema, no laceration, no pain and no spasm.  Neurological: She is alert and oriented to person, place, and time.  Skin: Skin is warm and dry. No erythema.  Psychiatric: She has a normal  mood and affect.    MAU Course  Procedures  MDM Limited OB US ordered due to the nature of the fall and abdominal tightening. Patient is too early of gestation for EFM. Preliminary report of [place] is normal without evidence of previa, abruption, normal AFI and FHR. Patient is driving today, will trial Percocet for short term relief over the next few days and Tylenol otherwise.   Assessment and Plan  A: [facility] at [redacted]w[redacted]d Fall at home, initial encounter   P:  Discharge home Rx for Percocet given to patient Tylenol advised otherwise for pain. Discussed limit of Tylenol for a 24 hour period Warning signs for worsening condition discussed Patient advised to follow-up with [facility] as scheudled or sooner PRN Patient may return to MAU as  needed or if her condition were to change or worsen   Vonzella Nipple, PA-C 06/27/2017, 1:40 AM

## 2017-06-26 NOTE — MAU Note (Signed)
Urine in the lab  
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
## 2017-06-26 NOTE — Discharge Instructions (Signed)
Preventing Injuries During Pregnancy Injuries can happen during pregnancy. Minor falls and accidents usually do not harm you or your baby. But some injuries can harm you and your baby. Tell your doctor about any injury you suffer. What can I do to avoid injuries? Safety  Remove rugs and loose objects on the floor.  Wear comfortable shoes that have a good grip. Do not wear shoes that have high heels.  Always wear your seat belt in the car. The lap belt should be below your belly. Always drive safely.  Do not ride on a motorcycle. Activity  Do not take part in rough and violent activities or sports.  Avoid: ? Walking on wet or slippery floors. ? Lifting heavy pots of boiling or hot liquids. ? Fixing electrical problems. ? Being near fires. General instructions  Take over-the-counter and prescription medicines only as told by your doctor.  Know your blood type and the blood type of the baby's father.  If you are a victim of domestic violence: ? Call your local emergency services (911 in the [place] ? Contact the National Domestic Violence Hotline for help and support. Get help right away if:  You fall on your belly or receive any serious blow to your belly.  You have a stiff neck or neck pain after a fall or an injury.  You get a headache or have problems with vision after an injury.  You do not feel the baby move or the baby is not moving as much as normal.  You have been a victim of domestic violence or any other kind of attack.  You have been in a car accident.  You have bleeding from your vagina.  Fluid is leaking from your vagina.  You start to have cramping or pain in your belly (contractions).  You have very bad pain in your lower back.  You feel weak or pass out (faint).  You start to throw up (vomit) after an injury.  You have been burned. Summary  Some injuries that happen during pregnancy can do harm to the baby.  Tell your doctor about any  injury.  Take steps to avoid injury. This includes removing rugs and loose objects on the floor. Always wear your seat belt in the car.  Do not take part in rough and violent activities or sports.  Get help right away if you have any serious accident or injury. This information is not intended to replace advice given to you by your health care provider. Make sure you discuss any questions you have with your health care provider. Document Released: 12/03/2010 Document Revised: 11/09/2016 Document Reviewed: 11/09/2016 Elsevier Interactive Patient Education  2017 [employer]  

## 2017-06-26 NOTE — MAU Note (Signed)
Pt presents to MAU and stated that she fell around 1900. Pt states she fell down about 10 stairs hitting her back and her right side. Pt states her stomach is getting tight which makes it hard to breathe. Pt is hurting on her back and right side as well. Pt denies bleeding and LOF.

## 2017-06-27 ENCOUNTER — Encounter (HOSPITAL_COMMUNITY): Payer: Self-pay

## 2017-07-01 IMAGING — US US OB TRANSVAGINAL
1 series · 15 of 28 positions shown · non-contrast
Comparison: None.

CLINICAL DATA: 26 y/o F; lower abdominal pain and cramping since
mid [REDACTED].

EXAM:
OBSTETRIC <14 WK US AND TRANSVAGINAL OB US
TECHNIQUE: Both transabdominal and transvaginal ultrasound examinations were
performed for complete evaluation of the gestation as well as the
maternal uterus, adnexal regions, and pelvic cul-de-sac.
Transvaginal technique was performed to assess early pregnancy.

[Series 1: us ob transvaginal · 15 of 76 slices shown]
[im 1/76]
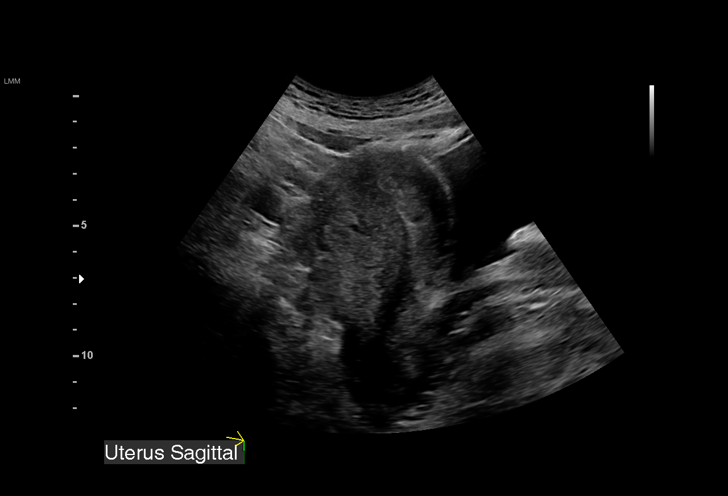
[im 6/76]
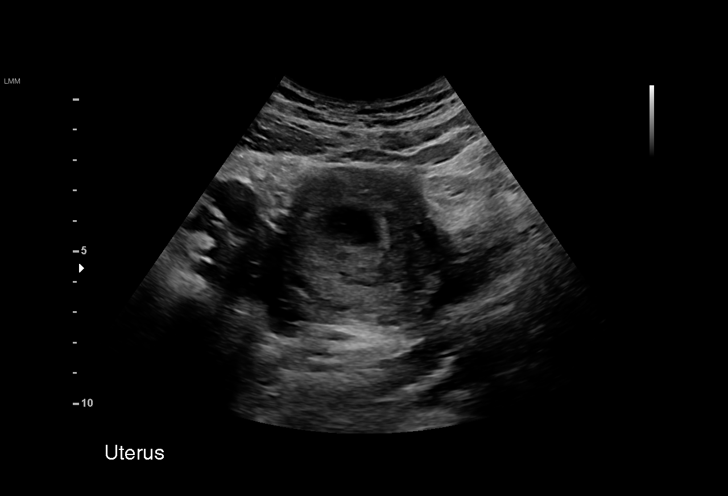
[im 12/76]
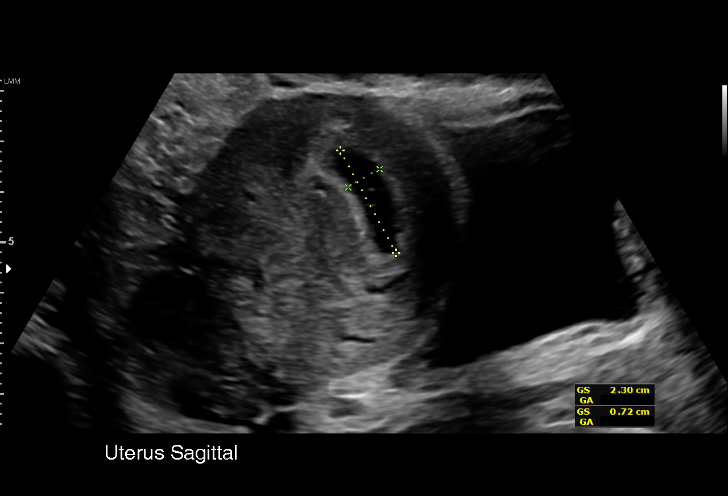
[im 17/76]
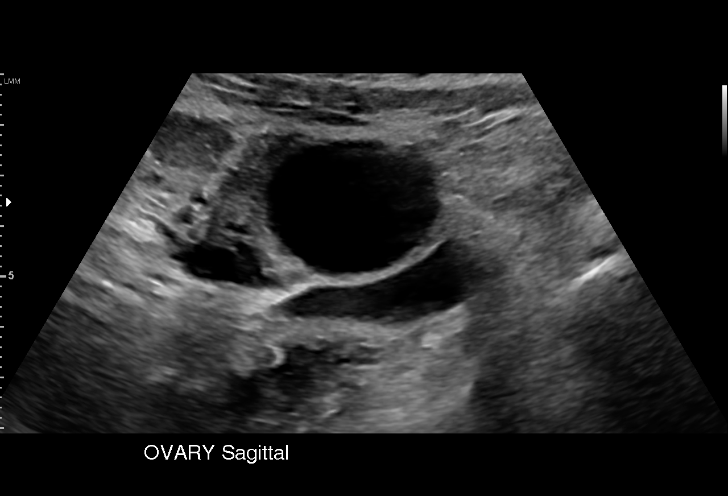
[im 23/76]
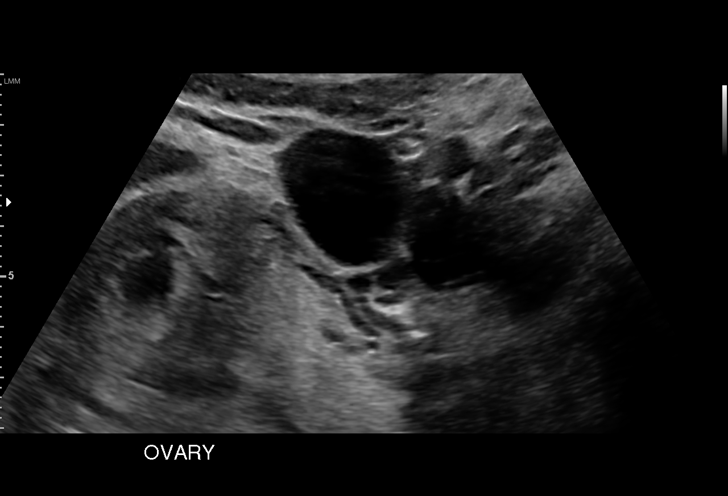
[im 28/76]
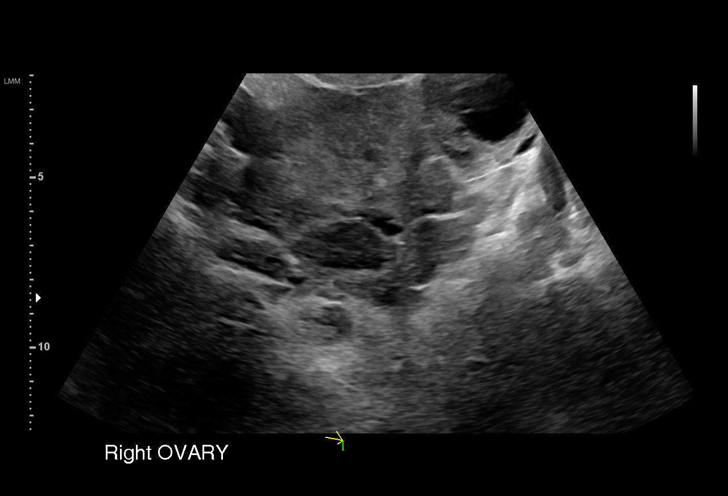
[im 34/76]
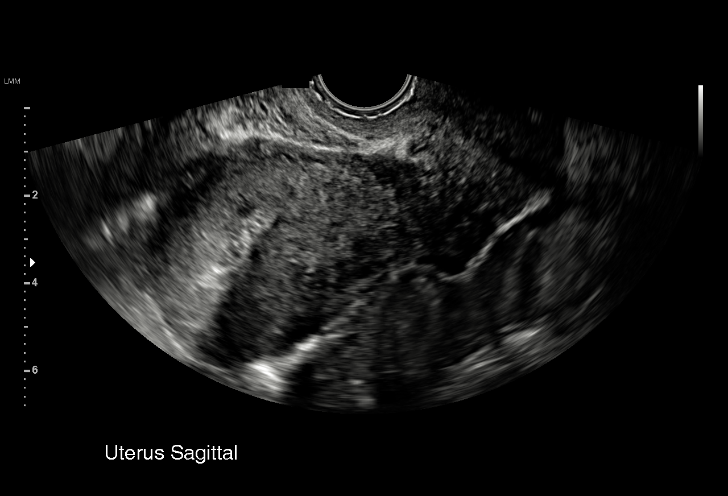
[im 39/76]
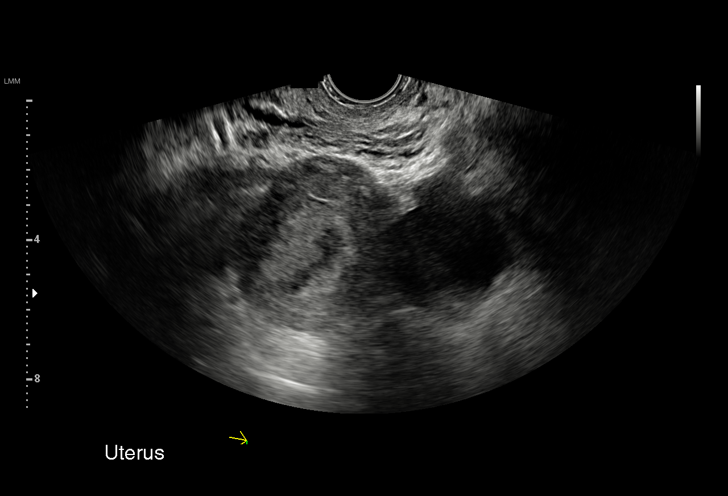
[im 42/76]
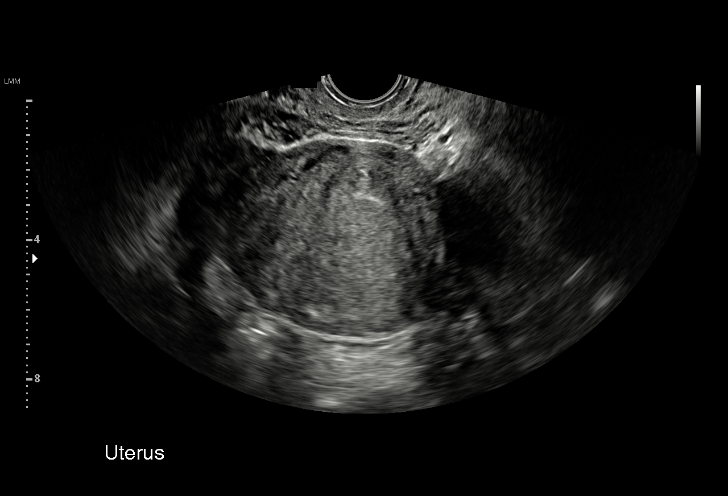
[im 48/76]
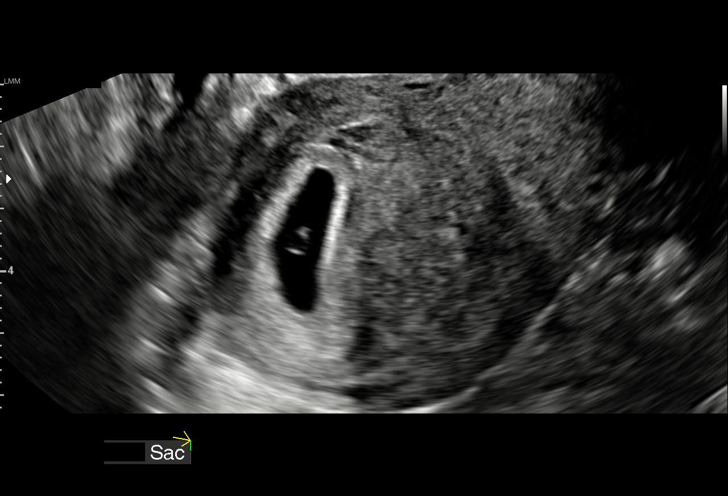
[im 53/76]
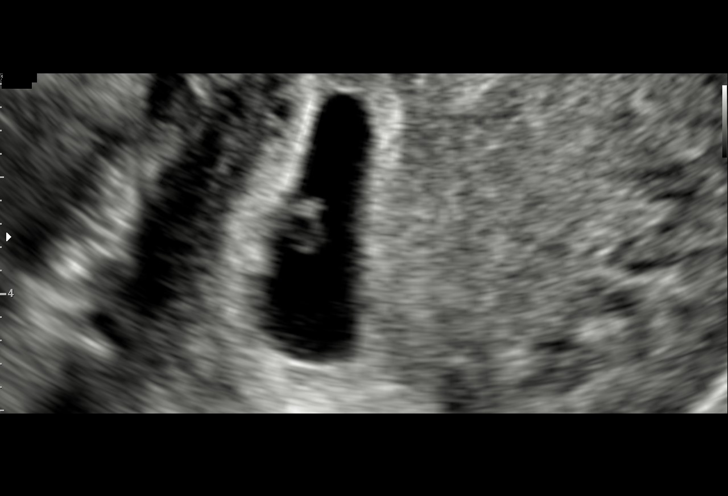
[im 59/76]
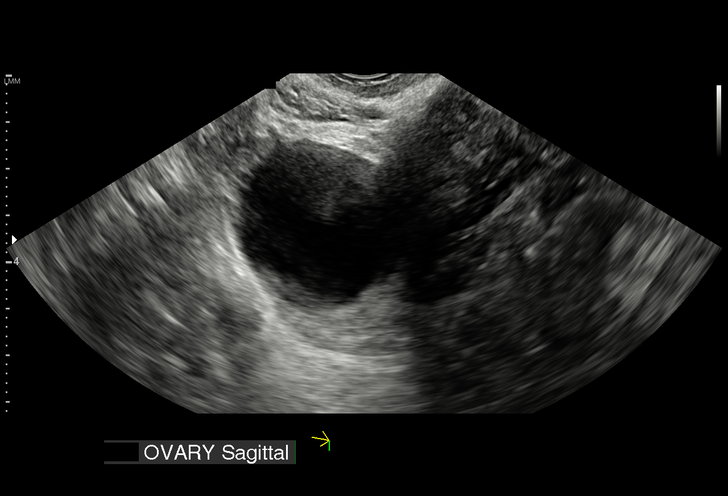
[im 64/76]
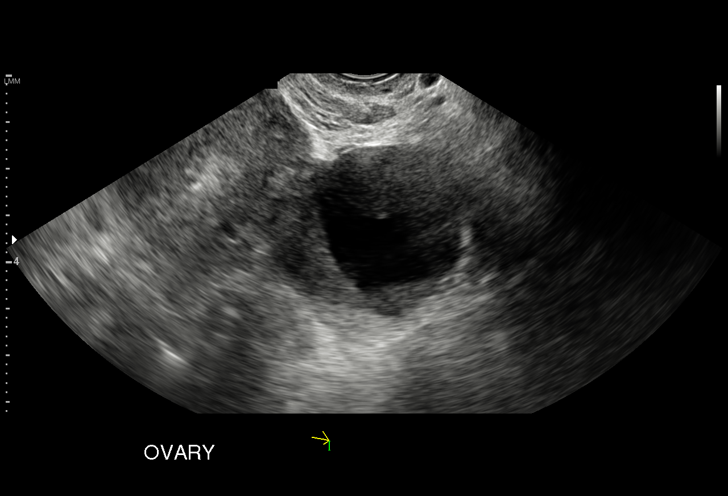
[im 70/76]
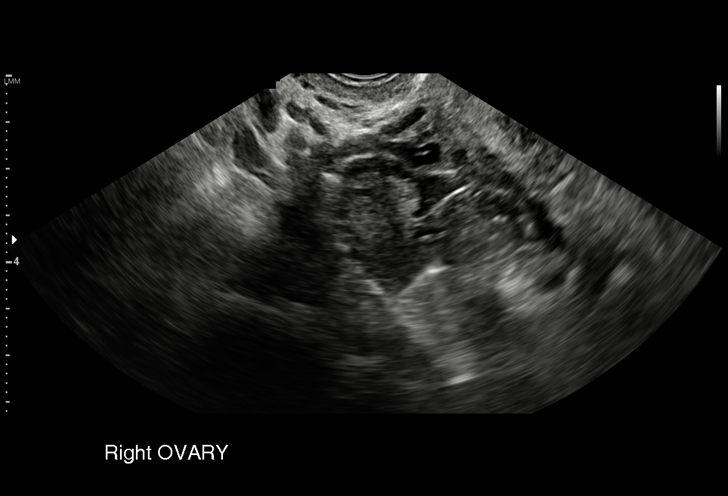
[im 76/76]
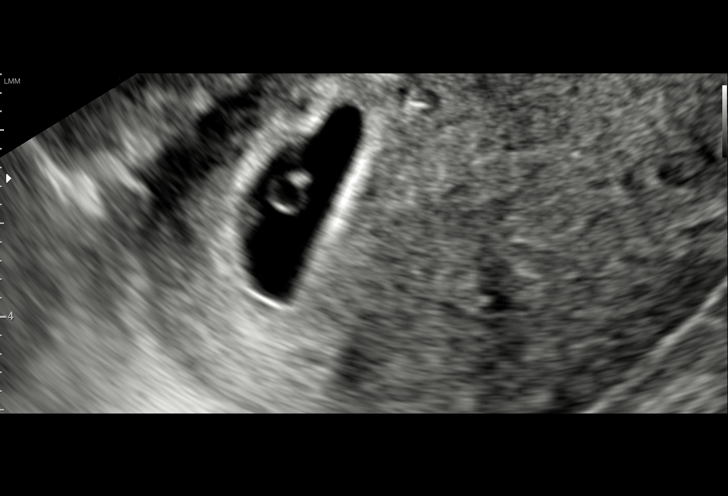

[15 of 28 positions shown; findings below may reference images not displayed]

FINDINGS: Intrauterine gestational sac: Single

Yolk sac:  Visualized.

Embryo:  Visualized.

Cardiac Activity: Visualized.

Heart Rate: 96  bpm

CRL:  2.3 mm  mm   5 w   5 d                  US EDC: 10/22/2017

Subchorionic hemorrhage:  None visualized.

Maternal uterus/adnexae: 3.6 cm simple left ovarian cyst. Otherwise
normal.
IMPRESSION: 1. Single live intrauterine pregnancy.
2. Fetal bradycardia, 96 bpm.

By: Nomasibulele Moatshe M.D.

## 2017-07-05 ENCOUNTER — Ambulatory Visit: Payer: Medicaid Other | Admitting: Adult Health

## 2017-07-06 ENCOUNTER — Encounter: Payer: Self-pay | Admitting: Adult Health

## 2017-07-15 IMAGING — US US OB TRANSVAGINAL
1 series · 15 of 28 positions shown · non-contrast
Comparison: 02/27/2017

CLINICAL DATA: Follow-up fetal bradycardia

EXAM:
TRANSVAGINAL OB ULTRASOUND
TECHNIQUE: Transvaginal ultrasound was performed for complete evaluation of the
gestation as well as the maternal uterus, adnexal regions, and
pelvic cul-de-sac.

[Series 1: us ob transvaginal · 15 of 35 slices shown]
[im 1/35]
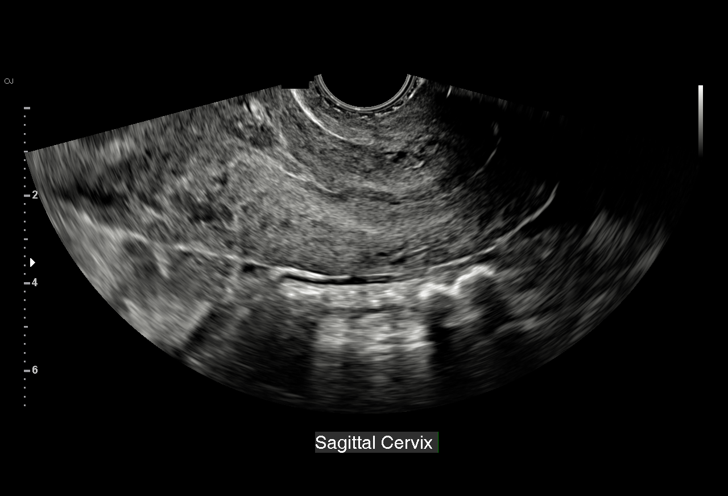
[im 3/35]
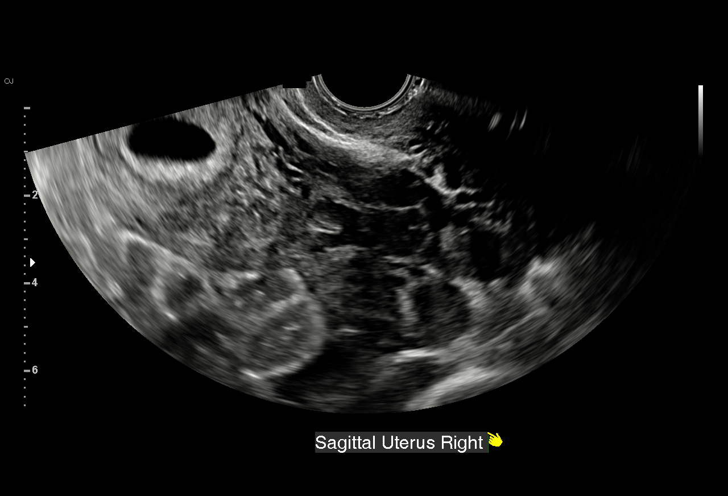
[im 6/35]
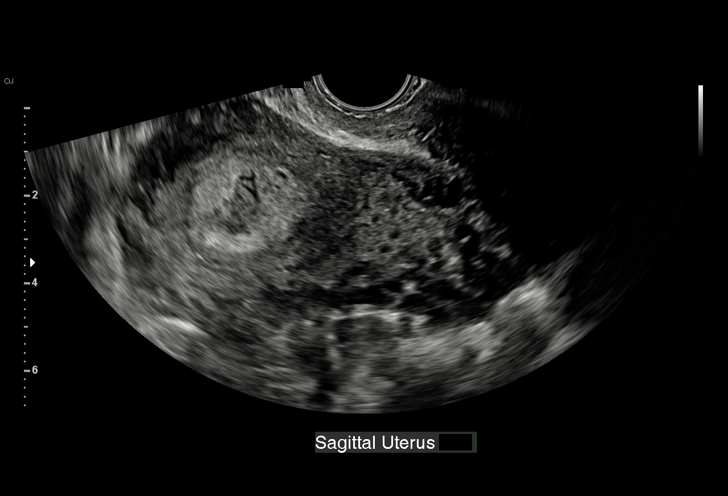
[im 8/35]
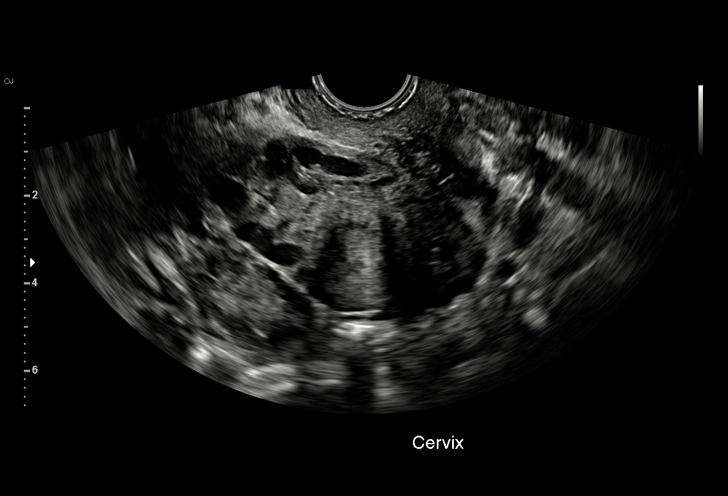
[im 11/35]
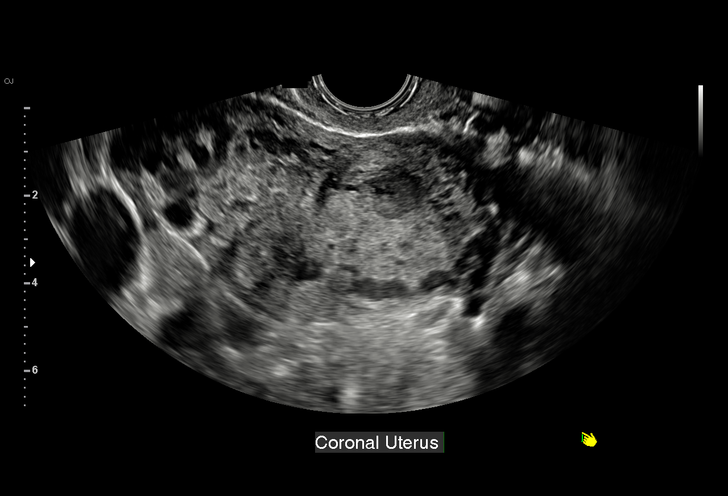
[im 13/35]
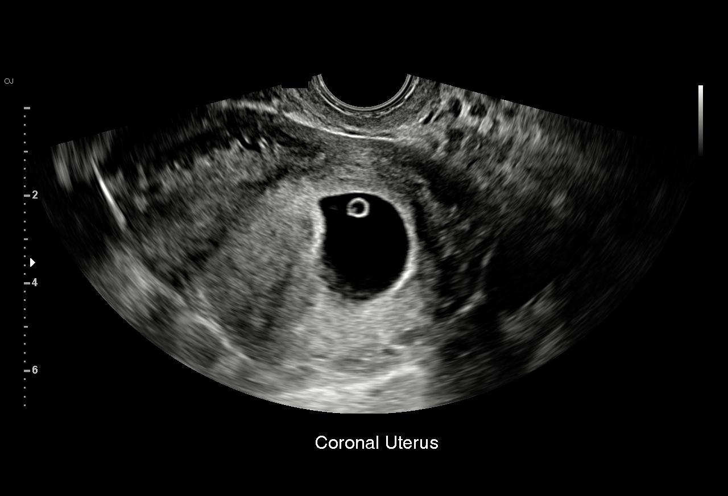
[im 16/35]
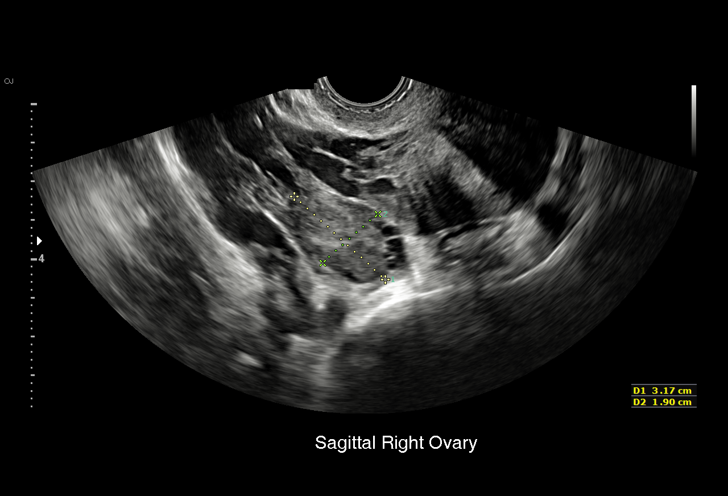
[im 18/35]
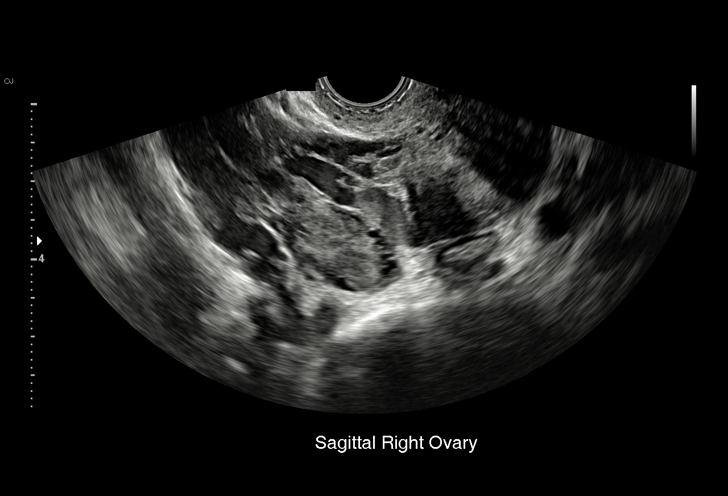
[im 19/35]
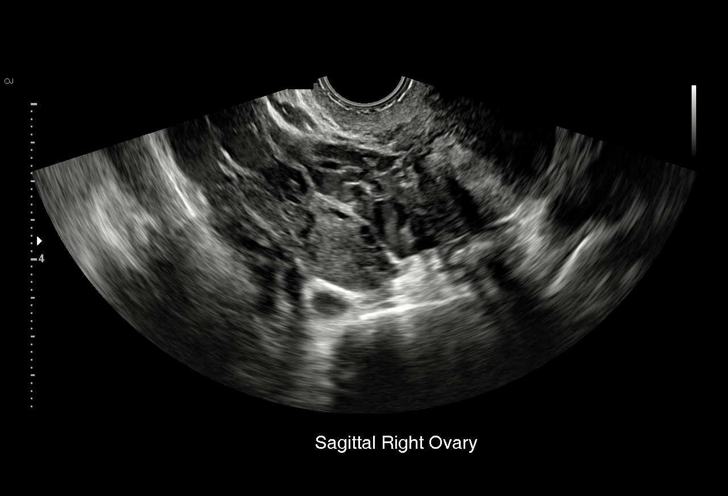
[im 22/35]
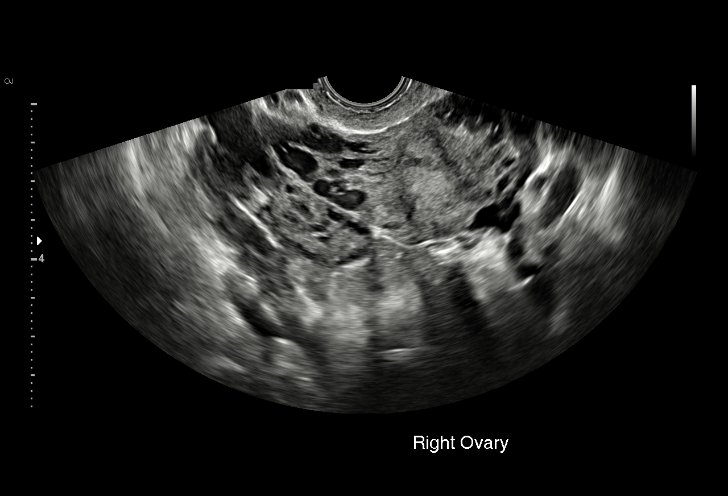
[im 24/35]
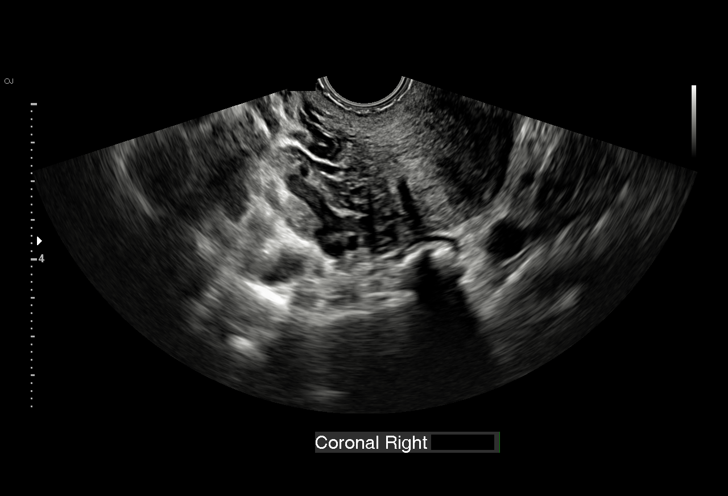
[im 27/35]
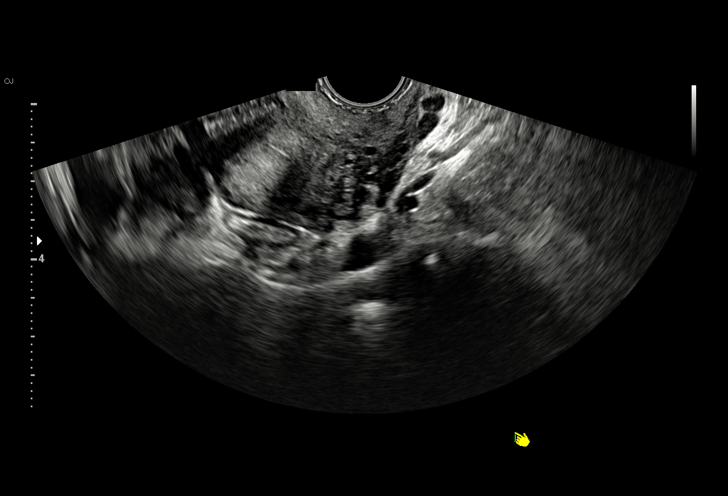
[im 29/35]
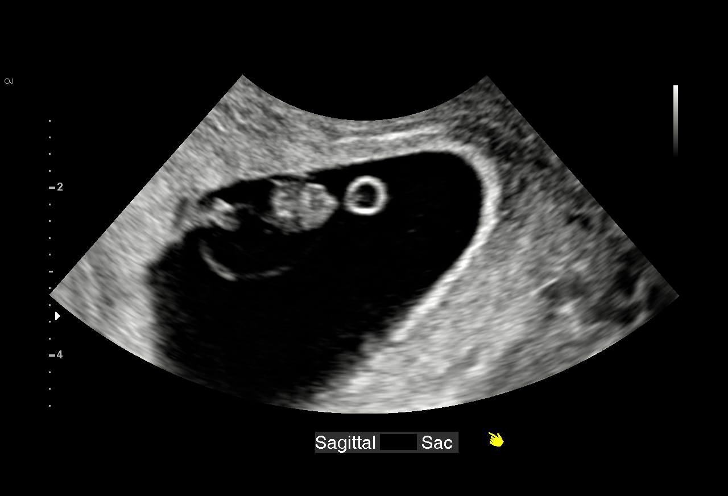
[im 32/35]
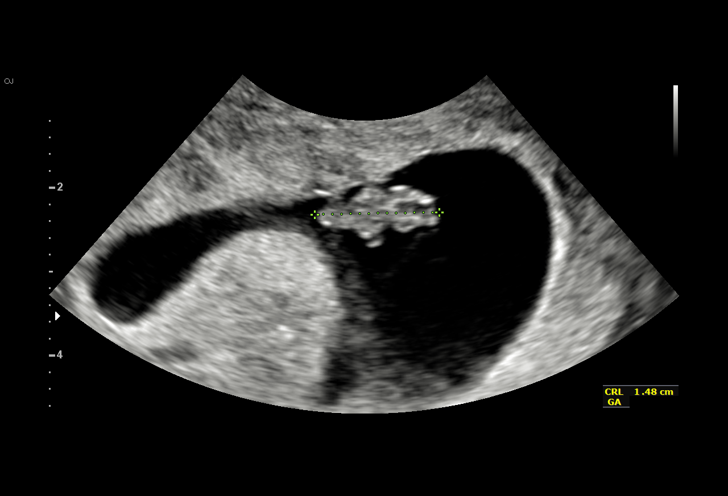
[im 35/35]
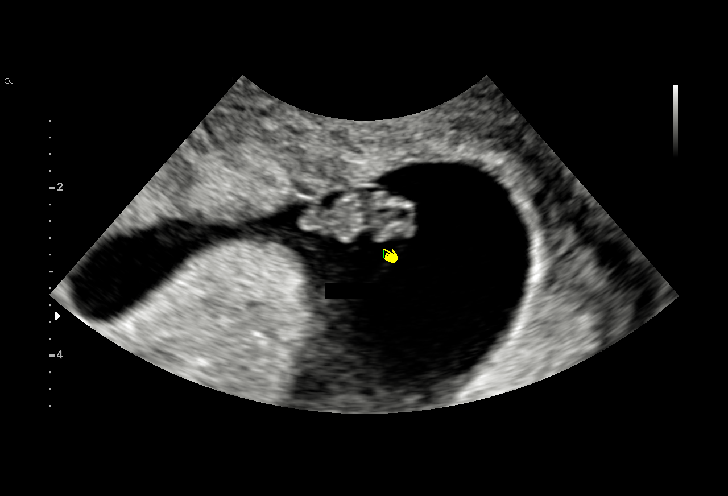

[15 of 28 positions shown; findings below may reference images not displayed]

FINDINGS: Intrauterine gestational sac: Present

Yolk sac:  Present

Embryo:  Present

Cardiac Activity: Present

Heart Rate: 158 bpm

CRL:   15  mm   7 w 5 d                  US EDC: 10/25/2017

Subchorionic hemorrhage:  None visualized.

Maternal uterus/adnexae: The right ovary is within normal limits.
The left ovary is not well visualized. Previously seen left ovarian
cyst is not identified.
IMPRESSION: Single live intrauterine gestation at 7 weeks 5 days.

## 2017-07-24 ENCOUNTER — Encounter (HOSPITAL_COMMUNITY): Payer: Self-pay | Admitting: *Deleted

## 2017-07-24 ENCOUNTER — Inpatient Hospital Stay (HOSPITAL_COMMUNITY)
Admission: AD | Admit: 2017-07-24 | Discharge: 2017-07-24 | Disposition: A | Discharge status: Home / Self Care | Payer: Medicaid Other | Source: Ambulatory Visit | Attending: Obstetrics & Gynecology | Admitting: Obstetrics & Gynecology

## 2017-07-24 DIAGNOSIS — O4702 False labor before 37 completed weeks of gestation, second trimester: Secondary | ICD-10-CM | POA: Diagnosis not present

## 2017-07-24 DIAGNOSIS — R350 Frequency of micturition: Secondary | ICD-10-CM | POA: Diagnosis present

## 2017-07-24 DIAGNOSIS — O2342 Unspecified infection of urinary tract in pregnancy, second trimester: Secondary | ICD-10-CM

## 2017-07-24 DIAGNOSIS — Z3A26 26 weeks gestation of pregnancy: Secondary | ICD-10-CM | POA: Insufficient documentation

## 2017-07-24 LAB — URINALYSIS, ROUTINE W REFLEX MICROSCOPIC
Bilirubin Urine: NEGATIVE
GLUCOSE, UA: NEGATIVE mg/dL
HGB URINE DIPSTICK: NEGATIVE
Ketones, ur: NEGATIVE mg/dL
NITRITE: NEGATIVE
Protein, ur: 30 mg/dL — AB
RBC / HPF: NONE SEEN RBC/hpf (ref 0–5)
SPECIFIC GRAVITY, URINE: 1.027 (ref 1.005–1.030)
pH: 5 (ref 5.0–8.0)

## 2017-07-24 LAB — WET PREP, GENITAL
Clue Cells Wet Prep HPF POC: NONE SEEN
SPERM: NONE SEEN
Trich, Wet Prep: NONE SEEN
YEAST WET PREP: NONE SEEN

## 2017-07-24 MED ORDER — CEPHALEXIN 500 MG PO CAPS: 500.0000 mg | ORAL_CAPSULE | Freq: Four times a day (QID) | ORAL | 0 refills | Status: AC

## 2017-07-24 NOTE — MAU Note (Signed)
Pt C/O contractions since 1700, also frequent urination, denies dysuria.  No bleeding or LOF.

## 2017-07-24 NOTE — MAU Provider Note (Signed)
History     CSN: [id number]  Arrival date and time: 07/24/17 [id number]   First Provider Initiated Contact with Patient 07/24/17 1852      Chief Complaint  Patient presents with  . Contractions  . Urinary Frequency   G3P2002 @26 .5 weeks here with urinary frequency and ctx since 1700. Had a 5 min ctx then voided a large amt. Feels need to urinate every few minutes since, only small amounts. She is unsure if urine or leaking vaginal fluid. No dysuria or hematuria. Also having ctx since 1700, unsure frequency. Denies VB. Reports good FM.   OB History    Gravida Para Term Preterm AB Living   3 2 2  0 0 2   SAB TAB Ectopic Multiple Live Births   0 0 0 0 2      Past Medical History:  Diagnosis Date  . Anemia   . Common migraine with intractable migraine 04/03/2017  . Dyspnea   . Epilepsy (HCC)   . Morbid obesity (HCC) 04/03/2017  . Narcolepsy   . Ovarian cyst   . Sleep apnea   . Vaginal Pap smear, abnormal     Past Surgical History:  Procedure Laterality Date  . NO PAST SURGERIES      Family History  Problem Relation Age of Onset  . Arthritis Mother   . Hypertension Mother   . Asthma Father   . Diabetes Father   . Drug abuse Father   . Hypertension Father   . Stroke Father   . Epilepsy Father   . Asthma Sister   . Diabetes Sister   . Hypertension Sister   . Miscarriages / Stillbirths Sister   . Graves' disease Sister   . Mental illness Paternal Aunt   . Cancer Paternal Grandmother     Social History  Substance Use Topics  . Smoking status: Never Smoker  . Smokeless tobacco: Never Used  . Alcohol use No    Allergies:  Allergies  Allergen Reactions  . Iodine Itching  . Latex Rash    Prescriptions Prior to Admission  Medication Sig Dispense Refill Last Dose  . acetaminophen (TYLENOL) 500 MG tablet Take 1,000 mg by mouth every 6 (six) hours as needed for headache.   Unknown at Unknown time  . amitriptyline (ELAVIL) 10 MG tablet Take one tablet at night for  one week, then take 2 tablets at night for one week, then take 3 tablets at night. (Patient taking differently: Take 20 mg by mouth at bedtime. Take one tablet at night for one week, then take 2 tablets at night for one week, then take 3 tablets at night.) 90 tablet 3 06/25/2017 at Unknown time  . levETIRAcetam (KEPPRA) 500 MG tablet Take 1 tablet (500 mg total) by mouth 2 (two) times daily. 60 tablet 5 06/25/2017 at Unknown time  . ondansetron (ZOFRAN ODT) 4 MG disintegrating tablet Take 1 tablet (4 mg total) by mouth every 8 (eight) hours as needed for nausea or vomiting. (Patient not taking: Reported on 05/29/2017) 15 tablet 0 Not Taking  . oxyCODONE-acetaminophen (PERCOCET/ROXICET) 5-325 MG tablet Take 1 tablet by mouth every 6 (six) hours as needed for severe pain. 12 tablet 0   . Prenatal Vit-Fe Fumarate-FA (PRENATAL MULTIVITAMIN) TABS tablet Take 1 tablet by mouth daily at 12 noon. 30 tablet 12 06/26/2017 at Unknown time    Review of Systems  Constitutional: Negative for chills and fever.  Gastrointestinal: Positive for abdominal pain.  Genitourinary: Positive for frequency. Negative for  dysuria, hematuria and vaginal bleeding.  Musculoskeletal: Positive for back pain.   Physical Exam   Blood pressure 111/66, pulse 89, temperature 97.7 F (36.5 C), temperature source Oral, resp. rate 20, last menstrual period 12/19/2016.  Physical Exam  Nursing note and vitals reviewed. Constitutional: She is oriented to person, place, and time. She appears well-developed and well-nourished. No distress.  HENT:  Head: Normocephalic and atraumatic.  Neck: Normal range of motion.  Cardiovascular: Normal rate.   Respiratory: Effort normal. No respiratory distress.  GI: Soft. She exhibits no distension. There is no tenderness.  Genitourinary:  Genitourinary Comments: External: no lesions or erythema Vagina: rugated, pink, moist, thin white discharge, no pool, fern neg Cervix closed/thick    Musculoskeletal: Normal range of motion.  Neurological: She is alert and oriented to person, place, and time.  Skin: Skin is warm and dry.  Psychiatric: She has a normal mood and affect.  EFM: 155 bpm, mod variability, + accels, no decels Toco: none  Results for orders placed or performed during the hospital encounter of 07/24/17 (from the past 24 hour(s))  Urinalysis, Routine w reflex microscopic     Status: Abnormal   Collection Time: 07/24/17  6:12 PM  Result Value Ref Range   Color, Urine AMBER (A) YELLOW   APPearance CLOUDY (A) CLEAR   Specific Gravity, Urine 1.027 1.005 - 1.030   pH 5.0 5.0 - 8.0   Glucose, UA NEGATIVE NEGATIVE mg/dL   Hgb urine dipstick NEGATIVE NEGATIVE   Bilirubin Urine NEGATIVE NEGATIVE   Ketones, ur NEGATIVE NEGATIVE mg/dL   Protein, ur 30 (A) NEGATIVE mg/dL   Nitrite NEGATIVE NEGATIVE   Leukocytes, UA MODERATE (A) NEGATIVE   RBC / HPF NONE SEEN 0 - 5 RBC/hpf   WBC, UA 6-30 0 - 5 WBC/hpf   Bacteria, UA MANY (A) NONE SEEN   Squamous Epithelial / LPF 6-30 (A) NONE SEEN   Mucus PRESENT   Wet prep, genital     Status: Abnormal   Collection Time: 07/24/17  7:00 PM  Result Value Ref Range   Yeast Wet Prep HPF POC NONE SEEN NONE SEEN   Trich, Wet Prep NONE SEEN NONE SEEN   Clue Cells Wet Prep HPF POC NONE SEEN NONE SEEN   WBC, Wet Prep HPF POC MODERATE (A) NONE SEEN   Sperm NONE SEEN    MAU Course  Procedures  MDM Labs ordered and reviewed. No evidence or SROM or PTL. Possible UTI although UA is contaminated. Will start Keflex and send UC. Stable for discharge home.  Assessment and Plan   1. [redacted] weeks gestation of pregnancy   2. UTI (urinary tract infection) during pregnancy, second trimester    Discharge home Follow up in OB office as scheduled this week Rx Keflex Tylenol prn back pain  Allergies as of 07/24/2017      Reactions   Iodine Itching   Latex Rash      Medication List    STOP taking these medications   ondansetron 4 MG  disintegrating tablet Commonly known as:  ZOFRAN ODT   oxyCODONE-acetaminophen 5-325 MG tablet Commonly known as:  PERCOCET/ROXICET     TAKE these medications   acetaminophen 500 MG tablet Commonly known as:  TYLENOL Take 1,000 mg by mouth every 6 (six) hours as needed for headache.   amitriptyline 10 MG tablet Commonly known as:  ELAVIL Take one tablet at night for one week, then take 2 tablets at night for one week, then take 3  tablets at night. What changed:  how much to take  how to take this  when to take this  additional instructions   cephALEXin 500 MG capsule Commonly known as:  KEFLEX Take 1 capsule (500 mg total) by mouth 4 (four) times daily.   levETIRAcetam 500 MG tablet Commonly known as:  KEPPRA Take 1 tablet (500 mg total) by mouth 2 (two) times daily.   prenatal multivitamin Tabs tablet Take 1 tablet by mouth daily at 12 noon.            Discharge Care Instructions        Start     Ordered   07/24/17 0000  Discharge patient    Question Answer Comment  Discharge disposition 01-Home or Self Care   Discharge patient date 07/24/2017      07/24/17 1923   07/24/17 0000  cephALEXin (KEFLEX) 500 MG capsule  4 times daily    Question:  Supervising Provider  Answer:  Jaynie Collins A   07/24/17 1923     Donette Larry, CNM 07/24/2017, 7:24 PM

## 2017-07-24 NOTE — MAU Note (Signed)
Pt presents with c/o ctxs and urinary frequency that began 1700 this evening.  Denies VB or LOF.  Reports +FM.

## 2017-07-24 NOTE — Discharge Instructions (Signed)
Braxton Hicks Contractions °Contractions of the uterus can occur throughout pregnancy, but they are not always a sign that you are in labor. You may have practice contractions called Braxton Hicks contractions. These false labor contractions are sometimes confused with true labor. °What are Braxton Hicks contractions? °Braxton Hicks contractions are tightening movements that occur in the muscles of the uterus before labor. Unlike true labor contractions, these contractions do not result in opening (dilation) and thinning of the cervix. Toward the end of pregnancy (32-34 weeks), Braxton Hicks contractions can happen more often and may become stronger. These contractions are sometimes difficult to tell apart from true labor because they can be very uncomfortable. You should not feel embarrassed if you go to the hospital with false labor. °Sometimes, the only way to tell if you are in true labor is for your health care provider to look for changes in the cervix. The health care provider will do a physical exam and may monitor your contractions. If you are not in true labor, the exam should show that your cervix is not dilating and your water has not broken. °If there are no prenatal problems or other health problems associated with your pregnancy, it is completely safe for you to be sent home with false labor. You may continue to have Braxton Hicks contractions until you go into true labor. °How can I tell the difference between true labor and false labor? °· Differences °? False labor °? Contractions last 30-70 seconds.: Contractions are usually shorter and not as strong as true labor contractions. °? Contractions become very regular.: Contractions are usually irregular. °? Discomfort is usually felt in the top of the uterus, and it spreads to the lower abdomen and low back.: Contractions are often felt in the front of the lower abdomen and in the groin. °? Contractions do not go away with walking.: Contractions may  go away when you walk around or change positions while lying down. °? Contractions usually become more intense and increase in frequency.: Contractions get weaker and are shorter-lasting as time goes on. °? The cervix dilates and gets thinner.: The cervix usually does not dilate or become thin. °Follow these instructions at home: °· Take over-the-counter and prescription medicines only as told by your health care provider. °· Keep up with your usual exercises and follow other instructions from your health care provider. °· Eat and drink lightly if you think you are going into labor. °· If Braxton Hicks contractions are making you uncomfortable: °? Change your position from lying down or resting to walking, or change from walking to resting. °? Sit and rest in a tub of warm water. °? Drink enough fluid to keep your urine clear or pale yellow. Dehydration may cause these contractions. °? Do slow and deep breathing several times an hour. °· Keep all follow-up prenatal visits as told by your health care provider. This is important. °Contact a health care provider if: °· You have a fever. °· You have continuous pain in your abdomen. °Get help right away if: °· Your contractions become stronger, more regular, and closer together. °· You have fluid leaking or gushing from your vagina. °· You pass blood-tinged mucus (bloody show). °· You have bleeding from your vagina. °· You have low back pain that you never had before. °· You feel your baby’s head pushing down and causing pelvic pressure. °· Your baby is not moving inside you as much as it used to. °Summary °· Contractions that occur before labor are   called Braxton Hicks contractions, false labor, or practice contractions. °· Braxton Hicks contractions are usually shorter, weaker, farther apart, and less regular than true labor contractions. True labor contractions usually become progressively stronger and regular and they become more frequent. °· Manage discomfort from  Braxton Hicks contractions by changing position, resting in a warm bath, drinking plenty of water, or practicing deep breathing. °This information is not intended to replace advice given to you by your health care provider. Make sure you discuss any questions you have with your health care provider. °Document Released: 10/31/2005 Document Revised: 09/19/2016 Document Reviewed: 09/19/2016 °[employer]vier Interactive Patient Education © 2017 [employer] ° °Pregnancy and Urinary Tract Infection °What is a urinary tract infection? °A urinary tract infection (UTI) is an infection of any part of the urinary tract. This includes the kidneys, the tubes that connect your kidneys to your bladder (ureters), the bladder, and the tube that carries urine out of your body (urethra). These organs make, store, and get rid of urine in the body. A UTI can be a bladder infection (cystitis) or a kidney infection (pyelonephritis). This infection may be caused by fungi, viruses, and bacteria. Bacteria are the most common cause of UTIs. °You are more likely to develop a UTI during pregnancy because: °· The physical and hormonal changes your body goes through can make it easier for bacteria to get into your urinary tract. °· Your growing baby puts pressure on your uterus and can affect urine flow. ° °Does a UTI place my baby at risk? °An untreated UTI during pregnancy could lead to a kidney infection, which can cause health problems that could affect your baby. Possible complications of an untreated UTI include: °· Having your baby before 37 weeks of pregnancy (premature). °· Having a baby with a low birth weight. °· Developing high blood pressure during pregnancy (preeclampsia). ° °What are the symptoms of a UTI? °Symptoms of a UTI include: °· Fever. °· Frequent urination or passing small amounts of urine frequently. °· Needing to urinate urgently. °· Pain or a burning sensation with urination. °· Urine that smells bad or unusual. °· Cloudy  urine. °· Pain in the lower abdomen or back. °· Trouble urinating. °· Blood in the urine. °· Vomiting or being less hungry than normal. °· Diarrhea or abdominal pain. °· Vaginal discharge. ° °What are the treatment options for a UTI during pregnancy? °Treatment for this condition may include: °· Antibiotic medicines that are safe to take during pregnancy. °· Other medicines to treat less common causes of UTI. ° °How can I prevent a UTI? ° °To prevent a UTI: °· Go to the bathroom as soon as you feel the need. °· Always wipe from front to back. °· Wash your genital area with soap and warm water daily. °· Empty your bladder before and after sex. °· Wear cotton underwear. °· Limit your intake of high sugar foods or drinks, such as regular soda, juice, and sweets. °· Drink 6-8 glasses of water daily. °· Do not wear tight-fitting pants. °· Do not douche or use deodorant sprays. °· Do not drink alcohol, caffeine, or carbonated drinks. These can irritate the bladder. ° °Contact a health care provider if: °· Your symptoms do not improve or get worse. °· You have a fever after two days of treatment. °· You have a rash. °· You have abnormal vaginal discharge. °· You have back or side pain. °· You have chills. °· You have nausea and vomiting. °Get help right away   if: °Seek immediate medical care if you are pregnant and: °· You feel contractions in your uterus. °· You have lower belly pain. °· You have a gush of fluid from your vagina. °· You have blood in your urine. °· You are vomiting and cannot keep down any medicines or water. ° °This information is not intended to replace advice given to you by your health care provider. Make sure you discuss any questions you have with your health care provider. °Document Released: 02/25/2011 Document Revised: 10/14/2016 Document Reviewed: 09/21/2015 °Elsevier Interactive Patient Education © 2017 [employer] ° °

## 2017-07-26 ENCOUNTER — Ambulatory Visit (INDEPENDENT_AMBULATORY_CARE_PROVIDER_SITE_OTHER): Payer: Medicaid Other | Admitting: Obstetrics & Gynecology

## 2017-07-26 ENCOUNTER — Other Ambulatory Visit: Payer: Medicaid Other

## 2017-07-26 DIAGNOSIS — O0992 Supervision of high risk pregnancy, unspecified, second trimester: Secondary | ICD-10-CM

## 2017-07-26 LAB — CULTURE, OB URINE

## 2017-07-26 NOTE — Patient Instructions (Signed)
Postpartum Tubal Ligation Postpartum tubal ligation (PPTL) is a procedure to close the fallopian tubes. This is done so that you cannot get pregnant. When the fallopian tubes are closed, the eggs that the ovaries release cannot enter the uterus, and sperm cannot reach the eggs. PPTL is done right after childbirth or 1-2 days after childbirth, before the uterus returns to its normal location. PPTL is sometimes called "getting your tubes tied." You should not have this procedure if you want to get pregnant someday or if you are unsure about having more children. Tell a health care provider about:  Any allergies you have.  All medicines you are taking, including vitamins, herbs, eye drops, creams, and over-the-counter medicines.  Previous problems you or members of your family have had with the use of anesthetics.  Any blood disorders you have.  Previous surgeries you have had.  Any medical conditions you may have.  Any past pregnancies. What are the risks? Generally, this is a safe procedure. However, problems may occur, including:  Infection.  Bleeding.  Injury to surrounding organs.  Side effects from anesthetics.  Failure of the procedure.  This procedure can increase your risk of a kind of pregnancy in which a fertilized egg attaches to the outside of the uterus (ectopic pregnancy). What happens before the procedure?  Ask your health care provider about: ? How much pain you can expect to have. ? What medicines you will be given for pain, especially if you are planning to breastfeed.  Follow instructions from your health care provider about eating and drinking restrictions. What happens during the procedure? If you had a vaginal delivery:  You may be given one or more of the following: ? A medicine that helps you relax (sedative). ? A medicine to numb the area (local anesthetic). ? A medicine to make you fall asleep (general anesthetic). ? A medicine that is injected  into an area of your body to numb everything below the injection site (regional anesthetic).  If you have been given a general anesthetic, a tube will be put down your throat to help you breathe.  An IV tube will be inserted into one of your veins to give you medicines and fluids during the procedure.  Your bladder may be emptied with a small tube (catheter).  An incision will be made just below your belly button.  Your fallopian tubes will be located and brought up through the incision.  Your fallopian tubes will be tied off, burned (cauterized), or blocked with a clip, ring, or clamp. A small portion in the center of each fallopian tube may be removed.  The incision will be closed with stitches (sutures).  A bandage (dressing) will be placed over the incision.  If you had a cesarean delivery:  Tubal ligation will be done through the incision that was used for the cesarean delivery of your baby.  The incision will be closed with sutures.  A dressing will be placed over the incision.  The procedure may vary among health care providers and hospitals. What happens after the procedure?  Your blood pressure, heart rate, breathing rate, and blood oxygen level will be monitored often until the medicines you were given have worn off.  You will be given pain medicine as needed.  Do not drive for 24 hours if you received a sedative. This information is not intended to replace advice given to you by your health care provider. Make sure you discuss any questions you have with your health   care provider. Document Released: 10/31/2005 Document Revised: 04/04/2016 Document Reviewed: 10/11/2015 Elsevier Interactive Patient Education  2018 [employer]  

## 2017-07-26 NOTE — Progress Notes (Signed)
   PRENATAL VISIT NOTE  Subjective:  Hanley HaysHattie Couvillon is a 26 y.o. G3P2002 at 7572w0d being seen today for ongoing prenatal care.  She is currently monitored for the following issues for this high-risk pregnancy and has Supervision of high-risk pregnancy; Seizure (HCC); Narcolepsy; Common migraine with intractable migraine; BMI 45.0-49.9, adult (HCC); Obesity in pregnancy; and Sleep apnea on her problem list.  Patient reports no complaints.  Contractions: Irritability. Vag. Bleeding: None.  Movement: Present. Denies leaking of fluid.   The following portions of the patient's history were reviewed and updated as appropriate: allergies, current medications, past family history, past medical history, past social history, past surgical history and problem list. Problem list updated.  Objective:   Vitals:   07/26/17 0848  BP: 109/72  Pulse: 81  Weight: (!) 138.9 kg (306 lb 3.2 oz)    Fetal Status: Fetal Heart Rate (bpm): 141   Movement: Present     General:  Alert, oriented and cooperative. Patient is in no acute distress.  Skin: Skin is warm and dry. No rash noted.   Cardiovascular: Normal heart rate noted  Respiratory: Normal respiratory effort, no problems with respiration noted  Abdomen: Soft, gravid, appropriate for gestational age.  Pain/Pressure: Present     Pelvic: Cervical exam deferred        Extremities: Normal range of motion.  Edema: Trace  Mental Status:  Normal mood and affect. Normal behavior. Normal judgment and thought content.   Assessment and Plan:  Pregnancy: G3P2002 at 5772w0d  1. Supervision of high risk pregnancy in second trimester Routine third trimester labs  Preterm labor symptoms and general obstetric precautions including but not limited to vaginal bleeding, contractions, leaking of fluid and fetal movement were reviewed in detail with the patient. Please refer to After Visit Summary for other counseling recommendations.  Return in about 2 weeks (around  08/09/2017).   Scheryl DarterJames Natajah Derderian, MD

## 2017-07-26 NOTE — Progress Notes (Signed)
ROB/GTT. Patient declined the FLU and TDAP vaccine. [facility]

## 2017-07-27 ENCOUNTER — Encounter (HOSPITAL_COMMUNITY): Payer: Self-pay

## 2017-07-27 ENCOUNTER — Other Ambulatory Visit: Payer: Self-pay | Admitting: Certified Nurse Midwife

## 2017-07-27 ENCOUNTER — Other Ambulatory Visit (HOSPITAL_COMMUNITY): Payer: Self-pay | Admitting: *Deleted

## 2017-07-27 ENCOUNTER — Ambulatory Visit (HOSPITAL_COMMUNITY)
Admission: RE | Admit: 2017-07-27 | Discharge: 2017-07-27 | Disposition: A | Discharge status: Home / Self Care | Payer: Medicaid Other | Source: Ambulatory Visit | Attending: Certified Nurse Midwife | Admitting: Certified Nurse Midwife

## 2017-07-27 DIAGNOSIS — G47419 Narcolepsy without cataplexy: Secondary | ICD-10-CM

## 2017-07-27 DIAGNOSIS — R569 Unspecified convulsions: Secondary | ICD-10-CM

## 2017-07-27 DIAGNOSIS — O0992 Supervision of high risk pregnancy, unspecified, second trimester: Secondary | ICD-10-CM

## 2017-07-27 DIAGNOSIS — Z3A27 27 weeks gestation of pregnancy: Secondary | ICD-10-CM

## 2017-07-27 DIAGNOSIS — O2690 Pregnancy related conditions, unspecified, unspecified trimester: Secondary | ICD-10-CM | POA: Diagnosis not present

## 2017-07-27 DIAGNOSIS — Z362 Encounter for other antenatal screening follow-up: Secondary | ICD-10-CM | POA: Insufficient documentation

## 2017-07-27 DIAGNOSIS — O99210 Obesity complicating pregnancy, unspecified trimester: Secondary | ICD-10-CM | POA: Diagnosis present

## 2017-07-27 DIAGNOSIS — Z6841 Body Mass Index (BMI) 40.0 and over, adult: Secondary | ICD-10-CM | POA: Insufficient documentation

## 2017-07-27 DIAGNOSIS — O99212 Obesity complicating pregnancy, second trimester: Secondary | ICD-10-CM

## 2017-07-27 DIAGNOSIS — G40909 Epilepsy, unspecified, not intractable, without status epilepticus: Secondary | ICD-10-CM

## 2017-07-27 DIAGNOSIS — O99353 Diseases of the nervous system complicating pregnancy, third trimester: Principal | ICD-10-CM

## 2017-07-27 LAB — CBC
HEMATOCRIT: 33.2 % — AB (ref 34.0–46.6)
Hemoglobin: 10.2 g/dL — ABNORMAL LOW (ref 11.1–15.9)
MCH: 24.5 pg — ABNORMAL LOW (ref 26.6–33.0)
MCHC: 30.7 g/dL — ABNORMAL LOW (ref 31.5–35.7)
MCV: 80 fL (ref 79–97)
Platelets: 242 10*3/uL (ref 150–379)
RBC: 4.17 x10E6/uL (ref 3.77–5.28)
RDW: 16 % — ABNORMAL HIGH (ref 12.3–15.4)
WBC: 11.8 10*3/uL — ABNORMAL HIGH (ref 3.4–10.8)

## 2017-07-27 LAB — GLUCOSE TOLERANCE, 2 HOURS W/ 1HR
GLUCOSE, 1 HOUR: 112 mg/dL (ref 65–179)
GLUCOSE, 2 HOUR: 94 mg/dL (ref 65–152)
Glucose, Fasting: 76 mg/dL (ref 65–91)

## 2017-07-27 LAB — RPR: RPR: NONREACTIVE

## 2017-07-27 LAB — HIV ANTIBODY (ROUTINE TESTING W REFLEX): HIV SCREEN 4TH GENERATION: NONREACTIVE

## 2017-08-01 ENCOUNTER — Other Ambulatory Visit: Payer: Self-pay | Admitting: Certified Nurse Midwife

## 2017-08-01 DIAGNOSIS — O0992 Supervision of high risk pregnancy, unspecified, second trimester: Secondary | ICD-10-CM

## 2017-08-09 ENCOUNTER — Ambulatory Visit (INDEPENDENT_AMBULATORY_CARE_PROVIDER_SITE_OTHER): Payer: Medicaid Other | Admitting: Obstetrics & Gynecology

## 2017-08-09 DIAGNOSIS — O0993 Supervision of high risk pregnancy, unspecified, third trimester: Secondary | ICD-10-CM

## 2017-08-09 DIAGNOSIS — O0990 Supervision of high risk pregnancy, unspecified, unspecified trimester: Secondary | ICD-10-CM

## 2017-08-09 NOTE — Patient Instructions (Signed)
Third Trimester of Pregnancy The third trimester is from week 28 through week 40 (months 7 through 9). The third trimester is a time when the unborn baby (fetus) is growing rapidly. At the end of the ninth month, the fetus is about 20 inches in length and weighs 6-10 pounds. Body changes during your third trimester Your body will continue to go through many changes during pregnancy. The changes vary from woman to woman. During the third trimester:  Your weight will continue to increase. You can expect to gain 25-35 pounds (11-16 kg) by the end of the pregnancy.  You may begin to get stretch marks on your hips, abdomen, and breasts.  You may urinate more often because the fetus is moving lower into your pelvis and pressing on your bladder.  You may develop or continue to have heartburn. This is caused by increased hormones that slow down muscles in the digestive tract.  You may develop or continue to have constipation because increased hormones slow digestion and cause the muscles that push waste through your intestines to relax.  You may develop hemorrhoids. These are swollen veins (varicose veins) in the rectum that can itch or be painful.  You may develop swollen, bulging veins (varicose veins) in your legs.  You may have increased body aches in the pelvis, back, or thighs. This is due to weight gain and increased hormones that are relaxing your joints.  You may have changes in your hair. These can include thickening of your hair, rapid growth, and changes in texture. Some women also have hair loss during or after pregnancy, or hair that feels dry or thin. Your hair will most likely return to normal after your baby is born.  Your breasts will continue to grow and they will continue to become tender. A yellow fluid (colostrum) may leak from your breasts. This is the first milk you are producing for your baby.  Your belly button may stick out.  You may notice more swelling in your hands,  face, or ankles.  You may have increased tingling or numbness in your hands, arms, and legs. The skin on your belly may also feel numb.  You may feel short of breath because of your expanding uterus.  You may have more problems sleeping. This can be caused by the size of your belly, increased need to urinate, and an increase in your body's metabolism.  You may notice the fetus "dropping," or moving lower in your abdomen (lightening).  You may have increased vaginal discharge.  You may notice your joints feel loose and you may have pain around your pelvic bone.  What to expect at prenatal visits You will have prenatal exams every 2 weeks until week 36. Then you will have weekly prenatal exams. During a routine prenatal visit:  You will be weighed to make sure you and the baby are growing normally.  Your blood pressure will be taken.  Your abdomen will be measured to track your baby's growth.  The fetal heartbeat will be listened to.  Any test results from the previous visit will be discussed.  You may have a cervical check near your due date to see if your cervix has softened or thinned (effaced).  You will be tested for Group B streptococcus. This happens between 35 and 37 weeks.  Your health care provider may ask you:  What your birth plan is.  How you are feeling.  If you are feeling the baby move.  If you have had   any abnormal symptoms, such as leaking fluid, bleeding, severe headaches, or abdominal cramping.  If you are using any tobacco products, including cigarettes, chewing tobacco, and electronic cigarettes.  If you have any questions.  Other tests or screenings that may be performed during your third trimester include:  Blood tests that check for low iron levels (anemia).  Fetal testing to check the health, activity level, and growth of the fetus. Testing is done if you have certain medical conditions or if there are problems during the  pregnancy.  Nonstress test (NST). This test checks the health of your baby to make sure there are no signs of problems, such as the baby not getting enough oxygen. During this test, a belt is placed around your belly. The baby is made to move, and its heart rate is monitored during movement.  What is false labor? False labor is a condition in which you feel small, irregular tightenings of the muscles in the womb (contractions) that usually go away with rest, changing position, or drinking water. These are called Braxton Hicks contractions. Contractions may last for hours, days, or even weeks before true labor sets in. If contractions come at regular intervals, become more frequent, increase in intensity, or become painful, you should see your health care provider. What are the signs of labor?  Abdominal cramps.  Regular contractions that start at 10 minutes apart and become stronger and more frequent with time.  Contractions that start on the top of the uterus and spread down to the lower abdomen and back.  Increased pelvic pressure and dull back pain.  A watery or bloody mucus discharge that comes from the vagina.  Leaking of amniotic fluid. This is also known as your "water breaking." It could be a slow trickle or a gush. Let your health care provider know if it has a color or strange odor. If you have any of these signs, call your health care provider right away, even if it is before your due date. Follow these instructions at home: Medicines  Follow your health care provider's instructions regarding medicine use. Specific medicines may be either safe or unsafe to take during pregnancy.  Take a prenatal vitamin that contains at least 600 micrograms (mcg) of folic acid.  If you develop constipation, try taking a stool softener if your health care provider approves. Eating and drinking  Eat a balanced diet that includes fresh fruits and vegetables, whole grains, good sources of protein  such as meat, eggs, or tofu, and low-fat dairy. Your health care provider will help you determine the amount of weight gain that is right for you.  Avoid raw meat and uncooked cheese. These carry germs that can cause birth defects in the baby.  If you have low calcium intake from food, talk to your health care provider about whether you should take a daily calcium supplement.  Eat four or five small meals rather than three large meals a day.  Limit foods that are high in fat and processed sugars, such as fried and sweet foods.  To prevent constipation: ? Drink enough fluid to keep your urine clear or pale yellow. ? Eat foods that are high in fiber, such as fresh fruits and vegetables, whole grains, and beans. Activity  Exercise only as directed by your health care provider. Most women can continue their usual exercise routine during pregnancy. Try to exercise for 30 minutes at least 5 days a week. Stop exercising if you experience uterine contractions.  Avoid heavy   lifting.  Do not exercise in extreme heat or humidity, or at high altitudes.  Wear low-heel, comfortable shoes.  Practice good posture.  You may continue to have sex unless your health care provider tells you otherwise. Relieving pain and discomfort  Take frequent breaks and rest with your legs elevated if you have leg cramps or low back pain.  Take warm sitz baths to soothe any pain or discomfort caused by hemorrhoids. Use hemorrhoid cream if your health care provider approves.  Wear a good support bra to prevent discomfort from breast tenderness.  If you develop varicose veins: ? Wear support pantyhose or compression stockings as told by your healthcare provider. ? Elevate your feet for 15 minutes, 3-4 times a day. Prenatal care  Write down your questions. Take them to your prenatal visits.  Keep all your prenatal visits as told by your health care provider. This is important. Safety  Wear your seat belt at  all times when driving.  Make a list of emergency phone numbers, including numbers for family, friends, the hospital, and police and fire departments. General instructions  Avoid cat litter boxes and soil used by cats. These carry germs that can cause birth defects in the baby. If you have a cat, ask someone to clean the litter box for you.  Do not travel far distances unless it is absolutely necessary and only with the approval of your health care provider.  Do not use hot tubs, steam rooms, or saunas.  Do not drink alcohol.  Do not use any products that contain nicotine or tobacco, such as cigarettes and e-cigarettes. If you need help quitting, ask your health care provider.  Do not use any medicinal herbs or unprescribed drugs. These chemicals affect the formation and growth of the baby.  Do not douche or use tampons or scented sanitary pads.  Do not cross your legs for long periods of time.  To prepare for the arrival of your baby: ? Take prenatal classes to understand, practice, and ask questions about labor and delivery. ? Make a trial run to the hospital. ? Visit the hospital and tour the maternity area. ? Arrange for maternity or paternity leave through employers. ? Arrange for family and friends to take care of pets while you are in the hospital. ? Purchase a rear-facing car seat and make sure you know how to install it in your car. ? Pack your hospital bag. ? Prepare the baby's nursery. Make sure to remove all pillows and stuffed animals from the baby's crib to prevent suffocation.  Visit your dentist if you have not gone during your pregnancy. Use a soft toothbrush to brush your teeth and be gentle when you floss. Contact a health care provider if:  You are unsure if you are in labor or if your water has broken.  You become dizzy.  You have mild pelvic cramps, pelvic pressure, or nagging pain in your abdominal area.  You have lower back pain.  You have persistent  nausea, vomiting, or diarrhea.  You have an unusual or bad smelling vaginal discharge.  You have pain when you urinate. Get help right away if:  Your water breaks before 37 weeks.  You have regular contractions less than 5 minutes apart before 37 weeks.  You have a fever.  You are leaking fluid from your vagina.  You have spotting or bleeding from your vagina.  You have severe abdominal pain or cramping.  You have rapid weight loss or weight gain.    You have shortness of breath with chest pain.  You notice sudden or extreme swelling of your face, hands, ankles, feet, or legs.  Your baby makes fewer than 10 movements in 2 hours.  You have severe headaches that do not go away when you take medicine.  You have vision changes. Summary  The third trimester is from week 28 through week 40, months 7 through 9. The third trimester is a time when the unborn baby (fetus) is growing rapidly.  During the third trimester, your discomfort may increase as you and your baby continue to gain weight. You may have abdominal, leg, and back pain, sleeping problems, and an increased need to urinate.  During the third trimester your breasts will keep growing and they will continue to become tender. A yellow fluid (colostrum) may leak from your breasts. This is the first milk you are producing for your baby.  False labor is a condition in which you feel small, irregular tightenings of the muscles in the womb (contractions) that eventually go away. These are called Braxton Hicks contractions. Contractions may last for hours, days, or even weeks before true labor sets in.  Signs of labor can include: abdominal cramps; regular contractions that start at 10 minutes apart and become stronger and more frequent with time; watery or bloody mucus discharge that comes from the vagina; increased pelvic pressure and dull back pain; and leaking of amniotic fluid. This information is not intended to replace advice  given to you by your health care provider. Make sure you discuss any questions you have with your health care provider. Document Released: 10/25/2001 Document Revised: 04/07/2016 Document Reviewed: 01/01/2013 Elsevier Interactive Patient Education  2017 [employer]  

## 2017-08-09 NOTE — Progress Notes (Signed)
   PRENATAL VISIT NOTE  Subjective:  Heather Ward is a 26 y.o. G3P2002 at [redacted]w[redacted]d being seen today for ongoing prenatal care.  She is currently monitored for the following issues for this high-risk pregnancy and has Supervision of high-risk pregnancy; Seizure (HCC); Narcolepsy; Common migraine with intractable migraine; BMI 45.0-49.9, adult (HCC); Obesity in pregnancy; and Sleep apnea on her problem list.  Patient reports no complaints.  Contractions: Irregular. Vag. Bleeding: None.  Movement: Present. Denies leaking of fluid.   The following portions of the patient's history were reviewed and updated as appropriate: allergies, current medications, past family history, past medical history, past social history, past surgical history and problem list. Problem list updated.  Objective:   Vitals:   08/09/17 0858  BP: 135/86  Pulse: 79  Weight: (!) 309 lb (140.2 kg)    Fetal Status: Fetal Heart Rate (bpm): 150 Fundal Height: 30 cm Movement: Present     General:  Alert, oriented and cooperative. Patient is in no acute distress.  Skin: Skin is warm and dry. No rash noted.   Cardiovascular: Normal heart rate noted  Respiratory: Normal respiratory effort, no problems with respiration noted  Abdomen: Soft, gravid, appropriate for gestational age.  Pain/Pressure: Present     Pelvic: Cervical exam deferred        Extremities: Normal range of motion.  Edema: Trace  Mental Status:  Normal mood and affect. Normal behavior. Normal judgment and thought content.   Assessment and Plan:  Pregnancy: G3P2002 at 70w0dBP repeated and was normal  1. Supervision of high risk pregnancy, antepartum   Preterm labor symptoms and general obstetric precautions including but not limited to vaginal bleeding, contractions, leaking of fluid and fetal movement were reviewed in detail with the patient. Please refer to After Visit Summary for other counseling recommendations.  2 week f/u   Scheryl Darter, MD

## 2017-08-15 ENCOUNTER — Encounter (HOSPITAL_COMMUNITY): Payer: Self-pay

## 2017-08-15 ENCOUNTER — Inpatient Hospital Stay (HOSPITAL_COMMUNITY)
Admission: AD | Admit: 2017-08-15 | Discharge: 2017-08-15 | Disposition: A | Discharge status: Home / Self Care | Payer: Medicaid Other | Source: Ambulatory Visit | Attending: Family Medicine | Admitting: Family Medicine

## 2017-08-15 ENCOUNTER — Telehealth: Payer: Self-pay

## 2017-08-15 DIAGNOSIS — Z813 Family history of other psychoactive substance abuse and dependence: Secondary | ICD-10-CM | POA: Insufficient documentation

## 2017-08-15 DIAGNOSIS — Z79899 Other long term (current) drug therapy: Secondary | ICD-10-CM | POA: Diagnosis not present

## 2017-08-15 DIAGNOSIS — G40909 Epilepsy, unspecified, not intractable, without status epilepticus: Secondary | ICD-10-CM | POA: Diagnosis not present

## 2017-08-15 DIAGNOSIS — O9989 Other specified diseases and conditions complicating pregnancy, childbirth and the puerperium: Secondary | ICD-10-CM

## 2017-08-15 DIAGNOSIS — K640 First degree hemorrhoids: Secondary | ICD-10-CM

## 2017-08-15 DIAGNOSIS — Z8249 Family history of ischemic heart disease and other diseases of the circulatory system: Secondary | ICD-10-CM | POA: Diagnosis not present

## 2017-08-15 DIAGNOSIS — O2243 Hemorrhoids in pregnancy, third trimester: Secondary | ICD-10-CM | POA: Insufficient documentation

## 2017-08-15 DIAGNOSIS — Z833 Family history of diabetes mellitus: Secondary | ICD-10-CM | POA: Insufficient documentation

## 2017-08-15 DIAGNOSIS — N939 Abnormal uterine and vaginal bleeding, unspecified: Secondary | ICD-10-CM | POA: Diagnosis present

## 2017-08-15 DIAGNOSIS — Z3A29 29 weeks gestation of pregnancy: Secondary | ICD-10-CM | POA: Insufficient documentation

## 2017-08-15 DIAGNOSIS — B373 Candidiasis of vulva and vagina: Secondary | ICD-10-CM | POA: Diagnosis not present

## 2017-08-15 DIAGNOSIS — Z820 Family history of epilepsy and other diseases of the nervous system: Secondary | ICD-10-CM | POA: Diagnosis not present

## 2017-08-15 DIAGNOSIS — Z825 Family history of asthma and other chronic lower respiratory diseases: Secondary | ICD-10-CM | POA: Diagnosis not present

## 2017-08-15 DIAGNOSIS — G4730 Sleep apnea, unspecified: Secondary | ICD-10-CM | POA: Diagnosis not present

## 2017-08-15 DIAGNOSIS — Z823 Family history of stroke: Secondary | ICD-10-CM | POA: Insufficient documentation

## 2017-08-15 DIAGNOSIS — O98813 Other maternal infectious and parasitic diseases complicating pregnancy, third trimester: Secondary | ICD-10-CM | POA: Insufficient documentation

## 2017-08-15 DIAGNOSIS — O99353 Diseases of the nervous system complicating pregnancy, third trimester: Secondary | ICD-10-CM | POA: Insufficient documentation

## 2017-08-15 DIAGNOSIS — Z8261 Family history of arthritis: Secondary | ICD-10-CM | POA: Diagnosis not present

## 2017-08-15 DIAGNOSIS — G47419 Narcolepsy without cataplexy: Secondary | ICD-10-CM | POA: Insufficient documentation

## 2017-08-15 DIAGNOSIS — B3731 Acute candidiasis of vulva and vagina: Secondary | ICD-10-CM

## 2017-08-15 DIAGNOSIS — Z91040 Latex allergy status: Secondary | ICD-10-CM | POA: Diagnosis not present

## 2017-08-15 DIAGNOSIS — O99213 Obesity complicating pregnancy, third trimester: Secondary | ICD-10-CM | POA: Insufficient documentation

## 2017-08-15 DIAGNOSIS — Z6841 Body Mass Index (BMI) 40.0 and over, adult: Secondary | ICD-10-CM | POA: Insufficient documentation

## 2017-08-15 LAB — WET PREP, GENITAL
CLUE CELLS WET PREP: NONE SEEN
Sperm: NONE SEEN
Trich, Wet Prep: NONE SEEN
YEAST WET PREP: NONE SEEN

## 2017-08-15 MED ORDER — HYDROCORTISONE ACETATE 25 MG RE SUPP: 25.0000 mg | Freq: Two times a day (BID) | RECTAL | 0 refills | Status: AC

## 2017-08-15 MED ORDER — ACETAMINOPHEN 500 MG PO TABS
1000.0000 mg | ORAL_TABLET | Freq: Once | ORAL | Status: AC
Start: 2017-08-15 — End: 2017-08-15
  Administered 2017-08-15: 1000 mg via ORAL
  Filled 2017-08-15: qty 2

## 2017-08-15 MED ORDER — TERCONAZOLE 0.4 % VA CREA: 1.0000 | TOPICAL_CREAM | Freq: Every day | VAGINAL | 0 refills | Status: AC

## 2017-08-15 NOTE — [facility] Provider Note (Signed)
History     CSN: [id number]  Arrival date and time: 08/15/17 1552   First Provider Initiated Contact with Patient 08/15/17 1717      Chief Complaint  Patient presents with  . Vaginal Bleeding   Heather Ward is a 26 y.o. G3P2002 at [redacted]w[redacted]d who presents today with vaginal bleeding and rectal bleeding that occurs after BM. She denies any contractions, but reports that she has "always had a lot of pain that makes it hard to walk". She reports normal fetal movement. She denies any LOF.    Vaginal Bleeding  The patient's primary symptoms include vaginal bleeding. The patient's pertinent negatives include no vaginal discharge. This is a new problem. The current episode started yesterday. The problem occurs intermittently. The problem has been unchanged. Pain severity now: "I always have pain" She is pregnant. Pertinent negatives include no chills, constipation, dysuria, fever, frequency, urgency or vomiting. The vaginal discharge was bloody. Vaginal bleeding amount: "it was about 1 tsp yesterday" She has been passing clots (about the size of a pencil point  ). She has not been passing tissue. Nothing aggravates the symptoms. She has tried nothing for the symptoms. Sexual activity: Patient denies intercourse in the last 48 hours.       Past Medical History:  Diagnosis Date  . Anemia   . Common migraine with intractable migraine 04/03/2017  . Dyspnea   . Epilepsy (HCC)   . Morbid obesity (HCC) 04/03/2017  . Narcolepsy   . Ovarian cyst   . Sleep apnea   . Vaginal Pap smear, abnormal     Past Surgical History:  Procedure Laterality Date  . NO PAST SURGERIES      Family History  Problem Relation Age of Onset  . Arthritis Mother   . Hypertension Mother   . Asthma Father   . Diabetes Father   . Drug abuse Father   . Hypertension Father   . Stroke Father   . Epilepsy Father   . Asthma Sister   . Diabetes Sister   . Hypertension Sister   . Miscarriages / Stillbirths Sister   .  Graves' disease Sister   . Mental illness Paternal Aunt   . Cancer Paternal Grandmother     Social History  Substance Use Topics  . Smoking status: Never Smoker  . Smokeless tobacco: Never Used  . Alcohol use No    Allergies:  Allergies  Allergen Reactions  . Iodine Itching  . Latex Rash    Prescriptions Prior to Admission  Medication Sig Dispense Refill Last Dose  . acetaminophen (TYLENOL) 500 MG tablet Take 1,000 mg by mouth every 6 (six) hours as needed for headache.   Not Taking  . amitriptyline (ELAVIL) 10 MG tablet Take one tablet at night for one week, then take 2 tablets at night for one week, then take 3 tablets at night. 90 tablet 3 Taking  . levETIRAcetam (KEPPRA) 500 MG tablet Take 1 tablet (500 mg total) by mouth 2 (two) times daily. 60 tablet 5 Taking  . Prenatal Vit-Fe Fumarate-FA (PRENATAL MULTIVITAMIN) TABS tablet Take 1 tablet by mouth daily at 12 noon. 30 tablet 12 Taking    Review of Systems  Constitutional: Negative for chills and fever.  Gastrointestinal: Positive for blood in stool. Negative for constipation and vomiting.  Genitourinary: Positive for vaginal bleeding. Negative for dysuria, frequency, urgency and vaginal discharge.   Physical Exam   Blood pressure 114/64, pulse 82, temperature 98.7 F (37.1 C), temperature source Oral, resp.  rate 19, height  (1.702 m), weight (!) 313 lb (142 kg), last menstrual period 12/19/2016, SpO2 100 %.  Physical Exam  Nursing note and vitals reviewed. Constitutional: She is oriented to person, place, and time. She appears well-developed and well-nourished. No distress.  HENT:  Head: Normocephalic.  Cardiovascular: Normal rate.   Respiratory: Effort normal.  GI: Soft. There is no tenderness. There is no rebound.  Genitourinary:  Genitourinary Comments:  External: no lesion Vagina: moderate amount of clumpy, white discharge. No blood see Cervix: pink, smooth, closed/thick  Uterus: AGA Rectal exam: some  blood noted, and hemorrhoids seen.    Neurological: She is alert and oriented to person, place, and time.  Skin: Skin is warm and dry.  Psychiatric: She has a normal mood and affect.    FHT: 145, moderate with 10x10 accels, no decels Toco: no UCs    Results for orders placed or performed during the hospital encounter of 08/15/17 (from the past 24 hour(s))  Wet prep, genital     Status: Abnormal   Collection Time: 08/15/17  6:01 PM  Result Value Ref Range   Yeast Wet Prep HPF POC NONE SEEN NONE SEEN   Trich, Wet Prep NONE SEEN NONE SEEN   Clue Cells Wet Prep HPF POC NONE SEEN NONE SEEN   WBC, Wet Prep HPF POC MANY (A) NONE SEEN   Sperm NONE SEEN     MAU Course  Procedures  MDM   Assessment and Plan   1. Yeast infection involving the vagina and surrounding area   2. Grade I hemorrhoids   3. [redacted] weeks gestation of pregnancy    DC home Comfort measures reviewed  3rd Trimester precautions  PTL precautions  Fetal kick counts RX: anusol BID, terazol 7 as directed  Return to MAU as needed FU with OB as planned  Follow-up Information    [facility] [place] Follow up.   Specialty:  Obstetrics and Gynecology Contact information: [address], [address] [place] [address] [phone]           Thressa Sheller 08/15/2017, 5:17 PM

## 2017-08-15 NOTE — Discharge Instructions (Signed)
Hemorrhoids Hemorrhoids are swollen veins in and around the rectum or anus. Hemorrhoids can cause pain, itching, or bleeding. Most of the time, they do not cause serious problems. They usually get better with diet changes, lifestyle changes, and other home treatments. Follow these instructions at home: Eating and drinking  Eat foods that have fiber, such as whole grains, beans, nuts, fruits, and vegetables. Ask your doctor about taking products that have added fiber (fibersupplements).  Drink enough fluid to keep your pee (urine) clear or pale yellow. For Pain and Swelling  Take a warm-water bath (sitz bath) for 20 minutes to ease pain. Do this 3-4 times a day.  If directed, put ice on the painful area. It may be helpful to use ice between your warm baths. ? Put ice in a plastic bag. ? Place a towel between your skin and the bag. ? Leave the ice on for 20 minutes, 2-3 times a day. General instructions  Take over-the-counter and prescription medicines only as told by your doctor. ? Medicated creams and medicines that are inserted into the anus (suppositories) may be used or applied as told.  Exercise often.  Go to the bathroom when you have the urge to poop (to have a bowel movement). Do not wait.  Avoid pushing too hard (straining) when you poop.  Keep the butt area dry and clean. Use wet toilet paper or moist paper towels.  Do not sit on the toilet for a long time. Contact a doctor if:  You have any of these: ? Pain and swelling that do not get better with treatment or medicine. ? Bleeding that will not stop. ? Trouble pooping or you cannot poop. ? Pain or swelling outside the area of the hemorrhoids. This information is not intended to replace advice given to you by your health care provider. Make sure you discuss any questions you have with your health care provider. Document Released: 08/09/2008 Document Revised: 04/07/2016 Document Reviewed: 07/15/2015 Elsevier  Interactive Patient Education  2018 [employer].  Vaginal Yeast infection, Adult Vaginal yeast infection is a condition that causes soreness, swelling, and redness (inflammation) of the vagina. It also causes vaginal discharge. This is a common condition. Some women get this infection frequently. What are the causes? This condition is caused by a change in the normal balance of the yeast (candida) and bacteria that live in the vagina. This change causes an overgrowth of yeast, which causes the inflammation. What increases the risk? This condition is more likely to develop in:  Women who take antibiotic medicines.  Women who have diabetes.  Women who take birth control pills.  Women who are pregnant.  Women who douche often.  Women who have a weak defense (immune) system.  Women who have been taking steroid medicines for a long time.  Women who frequently wear tight clothing.  What are the signs or symptoms? Symptoms of this condition include:  White, thick vaginal discharge.  Swelling, itching, redness, and irritation of the vagina. The lips of the vagina (vulva) may be affected as well.  Pain or a burning feeling while urinating.  Pain during sex.  How is this diagnosed? This condition is diagnosed with a medical history and physical exam. This will include a pelvic exam. Your health care provider will examine a sample of your vaginal discharge under a microscope. Your health care provider may send this sample for testing to confirm the diagnosis. How is this treated? This condition is treated with medicine. Medicines may  be over-the-counter or prescription. You may be told to use one or more of the following:  Medicine that is taken orally.  Medicine that is applied as a cream.  Medicine that is inserted directly into the vagina (suppository).  Follow these instructions at home:  Take or apply over-the-counter and prescription medicines only as told by your health  care provider.  Do not have sex until your health care provider has approved. Tell your sex partner that you have a yeast infection. That person should go to his or her health care provider if he or she develops symptoms.  Do not wear tight clothes, such as pantyhose or tight pants.  Avoid using tampons until your health care provider approves.  Eat more yogurt. This may help to keep your yeast infection from returning.  Try taking a sitz bath to help with discomfort. This is a warm water bath that is taken while you are sitting down. The water should only come up to your hips and should cover your buttocks. Do this 3-4 times per day or as told by your health care provider.  Do not douche.  Wear breathable, cotton underwear.  If you have diabetes, keep your blood sugar levels under control. Contact a health care provider if:  You have a fever.  Your symptoms go away and then return.  Your symptoms do not get better with treatment.  Your symptoms get worse.  You have new symptoms.  You develop blisters in or around your vagina.  You have blood coming from your vagina and it is not your menstrual period.  You develop pain in your abdomen. This information is not intended to replace advice given to you by your health care provider. Make sure you discuss any questions you have with your health care provider. Document Released: 08/10/2005 Document Revised: 04/13/2016 Document Reviewed: 05/04/2015 Elsevier Interactive Patient Education  2018 [employer].

## 2017-08-15 NOTE — Telephone Encounter (Signed)
Patient called in and stated that she was moving stuff around yesterday and felt a "drop" in her stomach with a sharp pain, pt then stated that when she went to the bathroom she noticed blood in her pants. Pt stated when she wiped there was fresh blood on the tissue and she has had light bleeding since yesterday, reports fetal movement. Advised patient to go to hospital to be evaluated, pt agreed.

## 2017-08-15 NOTE — MAU Note (Signed)
Urine sent to lab 

## 2017-08-15 NOTE — MAU Note (Signed)
Pt reports bleeding and pain since yesterday.

## 2017-08-16 LAB — GC/CHLAMYDIA PROBE AMP (~~LOC~~) NOT AT ARMC
CHLAMYDIA, DNA PROBE: NEGATIVE
NEISSERIA GONORRHEA: NEGATIVE

## 2017-08-23 ENCOUNTER — Encounter: Payer: Medicaid Other | Admitting: Obstetrics & Gynecology

## 2017-08-29 ENCOUNTER — Other Ambulatory Visit: Payer: Self-pay | Admitting: Certified Nurse Midwife

## 2017-09-13 ENCOUNTER — Ambulatory Visit (HOSPITAL_COMMUNITY): Payer: Medicaid Other

## 2018-01-17 ENCOUNTER — Encounter (HOSPITAL_COMMUNITY): Payer: Self-pay
